# Patient Record
Sex: Female | Born: 1990 | Race: Black or African American | Hispanic: No | Marital: Single | Smoking: Never smoker
Health system: Southern US, Community
[De-identification: ages and names within clinical notes are randomized; demographics above are authoritative.]

## PROBLEM LIST (undated history)

## (undated) DIAGNOSIS — J302 Other seasonal allergic rhinitis: Secondary | ICD-10-CM

## (undated) DIAGNOSIS — M069 Rheumatoid arthritis, unspecified: Secondary | ICD-10-CM

## (undated) HISTORY — PX: HERNIA REPAIR: SHX51

## (undated) HISTORY — DX: Rheumatoid arthritis, unspecified: M06.9

---

## 2008-08-12 ENCOUNTER — Emergency Department (HOSPITAL_COMMUNITY): Admission: EM | Admit: 2008-08-12 | Discharge: 2008-08-12 | Payer: Self-pay | Admitting: Emergency Medicine

## 2010-11-05 LAB — WET PREP, GENITAL
Trich, Wet Prep: NONE SEEN
Yeast Wet Prep HPF POC: NONE SEEN

## 2010-11-05 LAB — POCT URINALYSIS DIP (DEVICE)
Bilirubin Urine: NEGATIVE
Glucose, UA: NEGATIVE mg/dL
Hgb urine dipstick: NEGATIVE
Ketones, ur: NEGATIVE mg/dL
Specific Gravity, Urine: 1.02 (ref 1.005–1.030)
pH: 6.5 (ref 5.0–8.0)

## 2010-11-05 LAB — GC/CHLAMYDIA PROBE AMP, GENITAL: Chlamydia, DNA Probe: NEGATIVE

## 2011-07-05 ENCOUNTER — Emergency Department (INDEPENDENT_AMBULATORY_CARE_PROVIDER_SITE_OTHER)
Admission: EM | Admit: 2011-07-05 | Discharge: 2011-07-05 | Disposition: A | Discharge status: Home / Self Care | Payer: Self-pay | Source: Home / Self Care | Attending: Family Medicine | Admitting: Family Medicine

## 2011-07-05 DIAGNOSIS — N760 Acute vaginitis: Secondary | ICD-10-CM

## 2011-07-05 HISTORY — DX: Other seasonal allergic rhinitis: J30.2

## 2011-07-05 LAB — POCT URINALYSIS DIP (DEVICE)
Glucose, UA: NEGATIVE mg/dL
Nitrite: NEGATIVE
Protein, ur: NEGATIVE mg/dL
Specific Gravity, Urine: >=1.03 (ref 1.005–1.030)
Urobilinogen, UA: 0.2 mg/dL (ref 0.0–1.0)

## 2011-07-05 LAB — WET PREP, GENITAL: Trich, Wet Prep: NONE SEEN

## 2011-07-05 LAB — POCT PREGNANCY, URINE: Preg Test, Ur: NEGATIVE

## 2011-07-05 MED ORDER — FLUCONAZOLE 150 MG PO TABS: 150.0000 mg | ORAL_TABLET | Freq: Once | ORAL | Status: AC

## 2011-07-05 MED ORDER — METRONIDAZOLE 0.75 % VA GEL: 1.0000 | VAGINAL | Status: AC

## 2011-07-05 NOTE — ED Provider Notes (Signed)
History     CSN: [id number] Arrival date & time: 07/05/2011 11:16 AM   First MD Initiated Contact with Patient 07/05/11 1054      Chief Complaint  Patient presents with  . Vaginal Discharge    (Consider location/radiation/quality/duration/timing/severity/associated sxs/prior treatment) Patient is a 20 y.o. female presenting with vaginal discharge. The history is provided by the patient.  Vaginal Discharge This is a new problem. The current episode started more than 1 week ago. The problem occurs constantly. The problem has not changed since onset.Associated symptoms comments: Odor and itching..    Past Medical History  Diagnosis Date  . Seasonal allergies     History reviewed. No pertinent past surgical history.  No family history on file.  History  Substance Use Topics  . Smoking status: Never Smoker   . Smokeless tobacco: Not on file  . Alcohol Use: Yes    OB History    Grav Para Term Preterm Abortions TAB SAB Ect Mult Living                  Review of Systems  Constitutional: Negative.   Genitourinary: Positive for vaginal discharge. Negative for dysuria, urgency, vaginal bleeding and vaginal pain.    Allergies  Review of patient's allergies indicates no known allergies.  Home Medications   Current Outpatient Rx  Name Route Sig Dispense Refill  . ALLEGRA PO Oral Take by mouth as needed.      Marland Kitchen FLUCONAZOLE 150 MG PO TABS Oral Take 1 tablet (150 mg total) by mouth once. 1 tablet 1  . METRONIDAZOLE 0.75 % VA GEL Vaginal Place 1 Applicatorful vaginally 1 day or 1 dose. Nightly for 5 nights 70 g 0    BP 120/81  Pulse 63  Temp(Src) 99 F (37.2 C) (Oral)  Resp 20  SpO2 98%  LMP 07/02/2011  Physical Exam  Nursing note and vitals reviewed. Constitutional: She appears well-developed and well-nourished.  Abdominal: Soft. Bowel sounds are normal.  Genitourinary: Uterus normal. There is no rash or tenderness on the right labia. There is no rash or  tenderness on the left labia. Cervix exhibits no motion tenderness, no discharge and no friability. Right adnexum displays no tenderness. Left adnexum displays no tenderness. No tenderness or bleeding around the vagina. Vaginal discharge found.  Skin: Skin is warm and dry.    ED Course  Procedures (including critical care time)  Labs Reviewed  POCT URINALYSIS DIP (DEVICE) - Abnormal; Notable for the following:    Bilirubin Urine SMALL (*)    All other components within normal limits  POCT PREGNANCY, URINE  POCT PREGNANCY, URINE  POCT URINALYSIS DIPSTICK  GC/CHLAMYDIA PROBE AMP, GENITAL  WET PREP, GENITAL   No results found.   1. Vaginitis       MDM  U/a  neg        Barkley Bruns, MD 07/05/11 1207

## 2011-07-05 NOTE — ED Notes (Signed)
C/o vaginal discharge with itching and odor for 8 days.  Denies dysuria or pain.

## 2011-07-05 NOTE — ED Notes (Signed)
Labs and medications for 12/13 reviewed. Pt. adequately treated for clue cells with Metrogel. GC/chlamydia pending. Katie Sanders  07/05/2011

## 2011-07-06 LAB — GC/CHLAMYDIA PROBE AMP, GENITAL: GC Probe Amp, Genital: NEGATIVE

## 2011-07-09 NOTE — ED Notes (Signed)
GC/chlamydia neg.   Vassie Moselle 07/09/2011

## 2011-07-26 ENCOUNTER — Telehealth (HOSPITAL_COMMUNITY): Payer: Self-pay | Admitting: *Deleted

## 2011-12-20 ENCOUNTER — Emergency Department: Payer: Self-pay | Admitting: Emergency Medicine

## 2011-12-20 LAB — CBC
HGB: 13 g/dL (ref 12.0–16.0)
RBC: 4 10*6/uL (ref 3.80–5.20)
RDW: 12.5 % (ref 11.5–14.5)

## 2011-12-20 LAB — WET PREP, GENITAL

## 2012-11-15 ENCOUNTER — Emergency Department: Payer: Self-pay | Admitting: Emergency Medicine

## 2012-11-15 LAB — CBC
HCT: 36.8 % (ref 35.0–47.0)
MCV: 95 fL (ref 80–100)
Platelet: 253 10*3/uL (ref 150–440)
RBC: 3.86 10*6/uL (ref 3.80–5.20)

## 2012-11-15 LAB — COMPREHENSIVE METABOLIC PANEL
Alkaline Phosphatase: 69 U/L (ref 50–136)
Anion Gap: 6 — ABNORMAL LOW (ref 7–16)
Bilirubin,Total: 0.4 mg/dL (ref 0.2–1.0)
Calcium, Total: 9 mg/dL (ref 8.5–10.1)
Chloride: 105 mmol/L (ref 98–107)
EGFR (African American): >60
EGFR (Non-African Amer.): >60
Potassium: 3.9 mmol/L (ref 3.5–5.1)
SGOT(AST): 18 U/L (ref 15–37)
SGPT (ALT): 26 U/L (ref 12–78)

## 2012-11-15 LAB — URINALYSIS, COMPLETE
Bilirubin,UR: NEGATIVE
Blood: NEGATIVE
Glucose,UR: NEGATIVE mg/dL (ref 0–75)
Protein: NEGATIVE
WBC UR: 1 /HPF (ref 0–5)

## 2012-11-15 LAB — HCG, QUANTITATIVE, PREGNANCY: Beta Hcg, Quant.: 58515 m[IU]/mL — ABNORMAL HIGH

## 2012-11-15 LAB — LIPASE, BLOOD: Lipase: 78 U/L (ref 73–393)

## 2013-07-05 IMAGING — US US OB < 14 WEEKS
1 series · 14 of 28 positions shown · non-contrast
Comparison: none

REASON FOR EXAM: pregnant vaginal bleeding
COMMENTS:

PROCEDURE:     US  - US OB LESS THAN 14 WEEKS  - November 15, 2012  [DATE]
RESULT:     Comparison: None
TECHNIQUE: Multiple transabdominal gray-scale images and endovaginal
gray-scale images of the pelvis performed.

[Series 1: us ob < 14 weeks · 0.23mm/px · 14 of 54 slices shown]
[im 2/54]
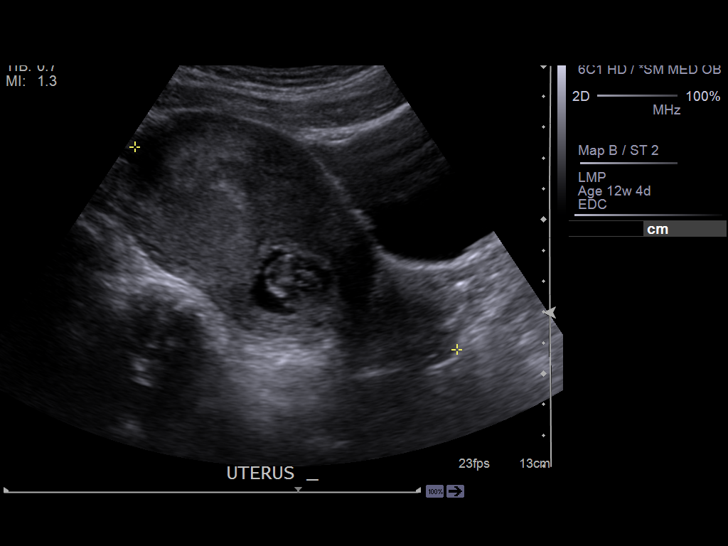
[im 6/54]
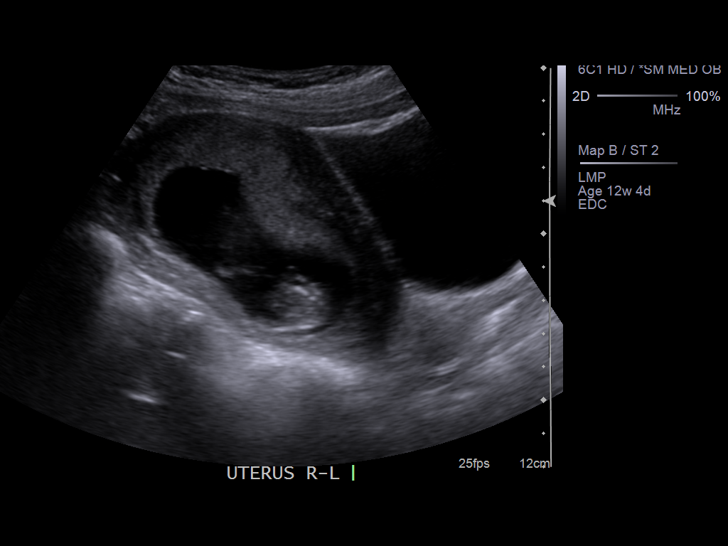
[im 10/54]
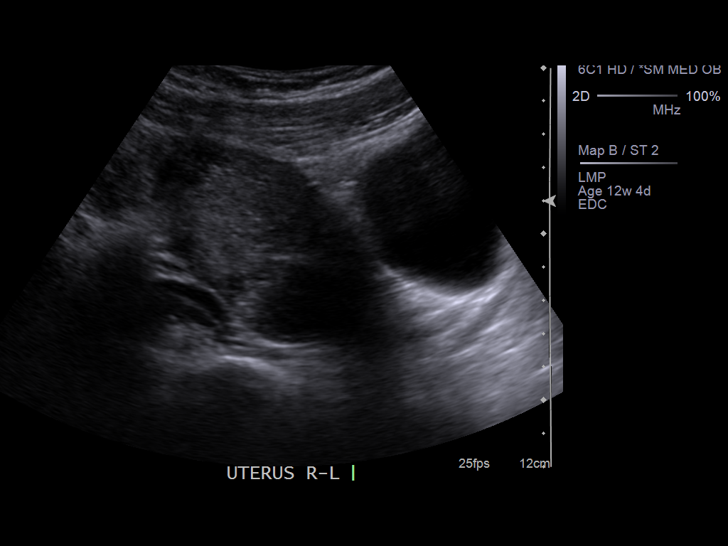
[im 14/54]
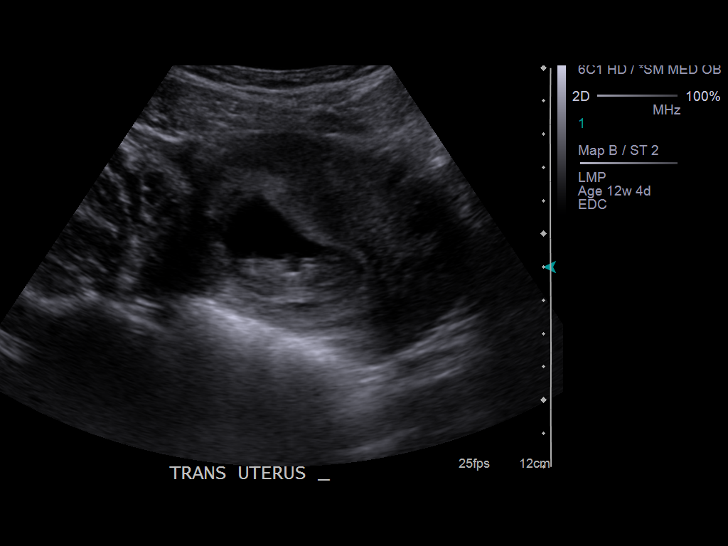
[im 18/54]
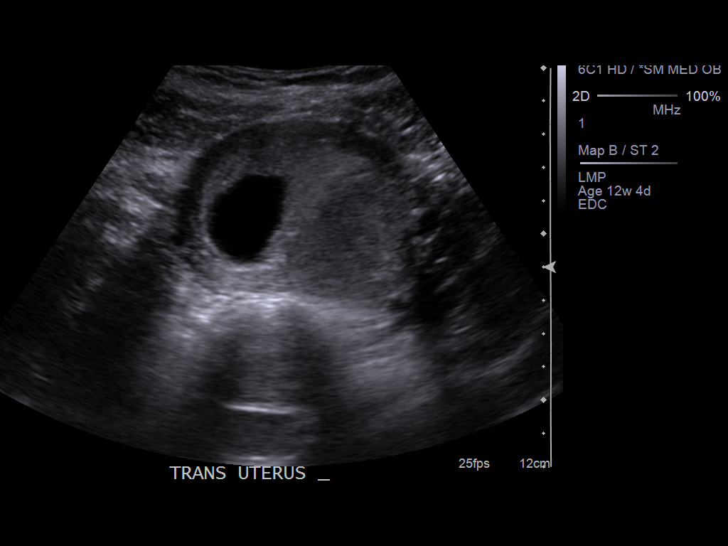
[im 22/54]
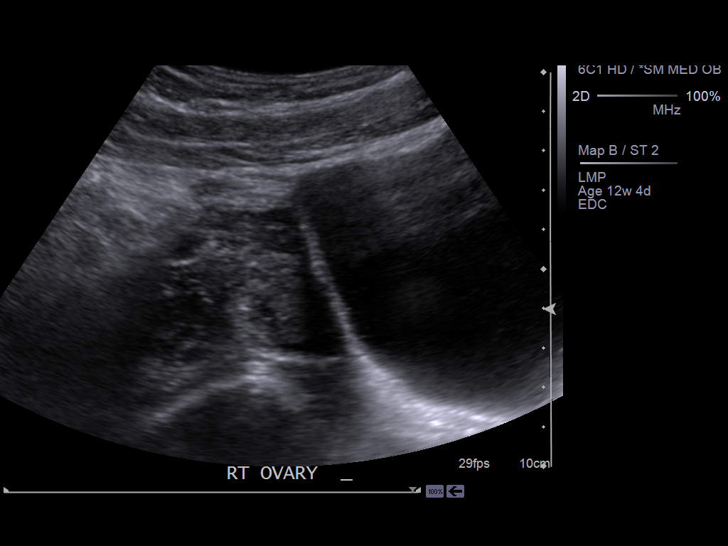
[im 26/54]
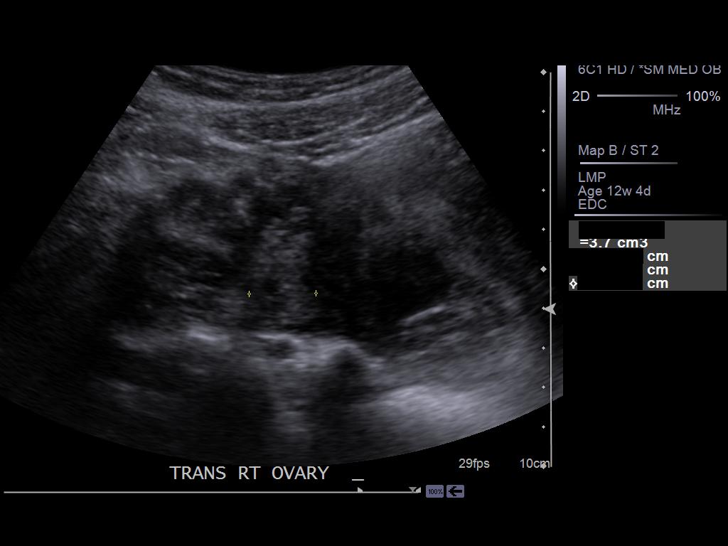
[im 30/54]
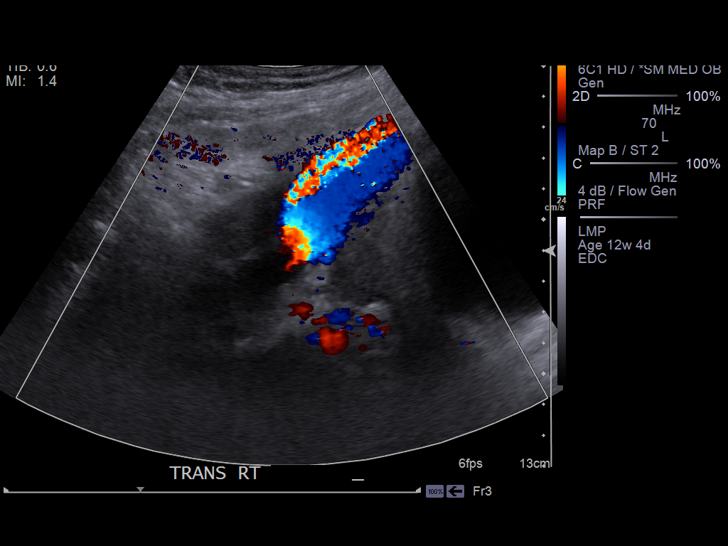
[im 34/54]
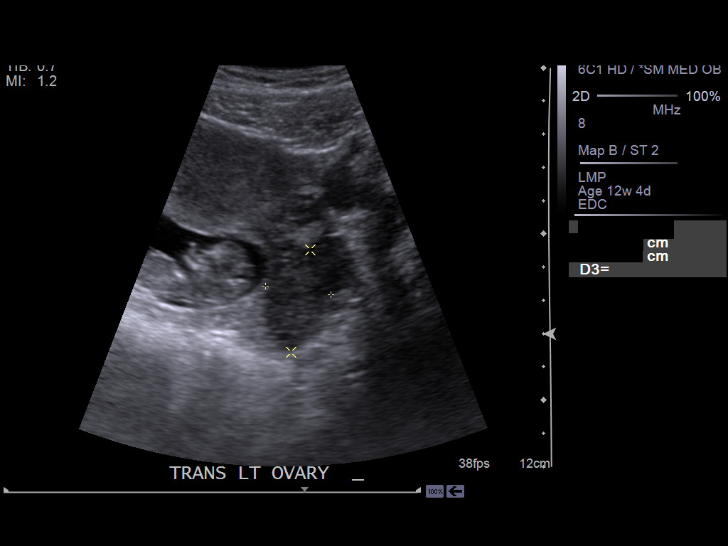
[im 38/54]
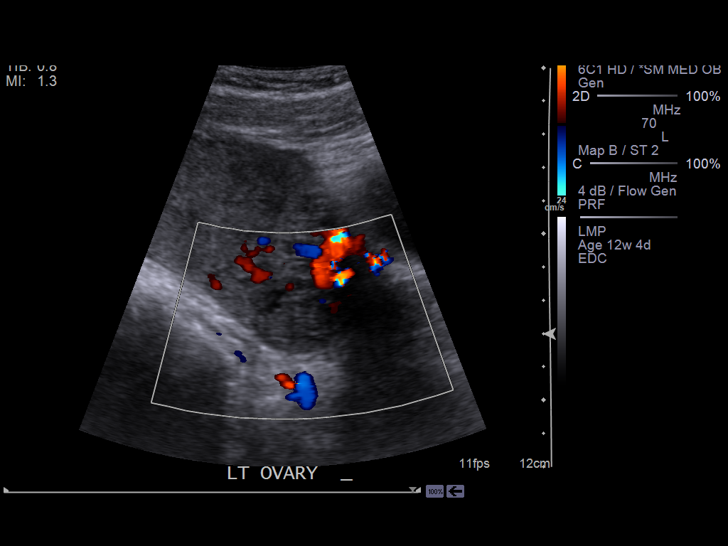
[im 42/54]
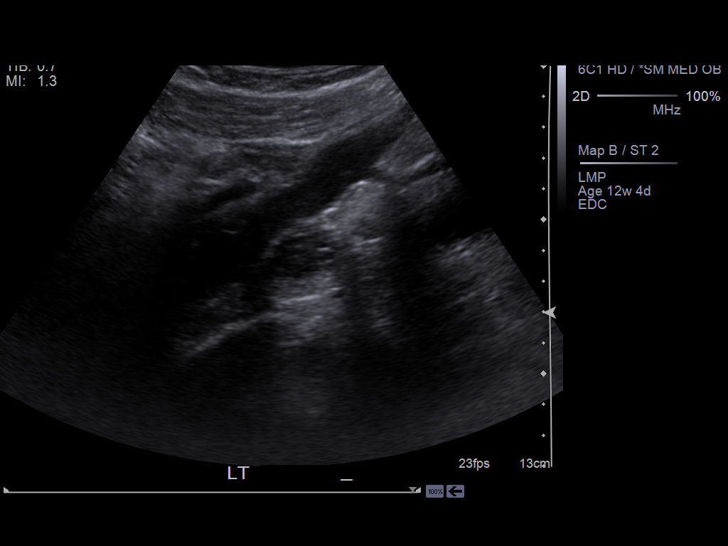
[im 46/54]
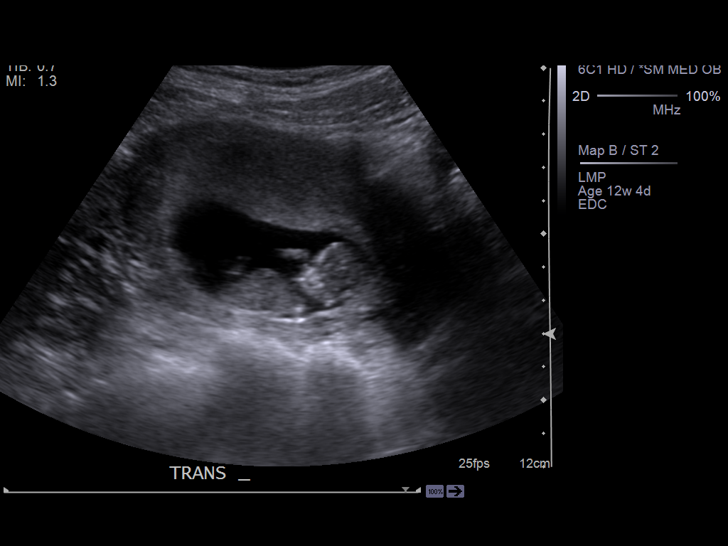
[im 50/54]
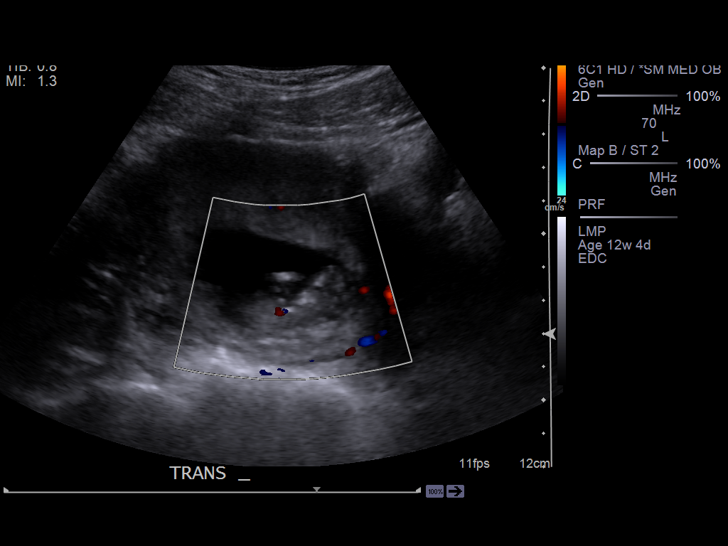
[im 54/54]
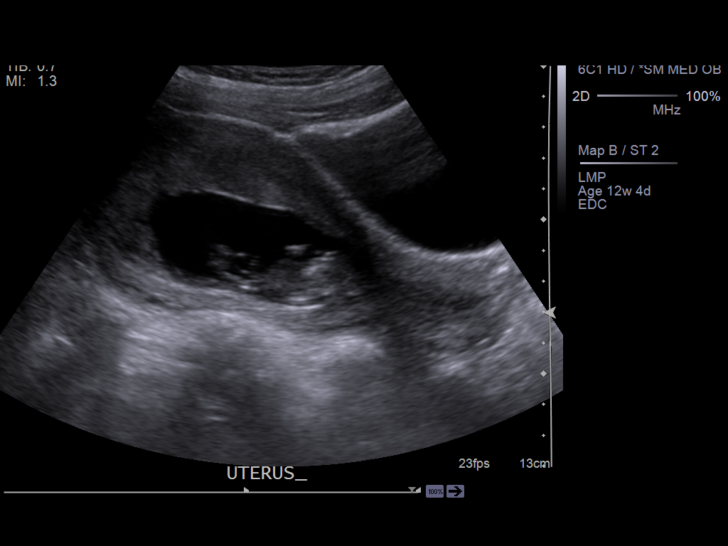

[14 of 28 positions shown; findings below may reference images not displayed]

FINDINGS: There is a single live intrauterine pregnancy visualized with a crown-rump
length of 4.48 cm dating the pregnancy at 11 weeks 2 days. There is a normal
yolk sac. There is a normal fetal heart rate of 165 beats per minute.

The right ovary measures 1.8 x 2.4 x 1.7 cm.  The left ovary measures 2 x
3.1 x 3.1 cm.  There is no adnexal mass.

There is no pelvic free fluid.
IMPRESSION: Single live intrauterine pregnancy dating 11 weeks 2 days with an estimated
due date of 06/04/2013.

[REDACTED]

## 2018-08-14 DIAGNOSIS — R768 Other specified abnormal immunological findings in serum: Secondary | ICD-10-CM | POA: Insufficient documentation

## 2018-08-14 DIAGNOSIS — M0579 Rheumatoid arthritis with rheumatoid factor of multiple sites without organ or systems involvement: Secondary | ICD-10-CM | POA: Insufficient documentation

## 2018-08-14 DIAGNOSIS — Z79899 Other long term (current) drug therapy: Secondary | ICD-10-CM | POA: Insufficient documentation

## 2018-09-08 DIAGNOSIS — E669 Obesity, unspecified: Secondary | ICD-10-CM | POA: Insufficient documentation

## 2019-08-26 DIAGNOSIS — F32A Depression, unspecified: Secondary | ICD-10-CM | POA: Insufficient documentation

## 2020-02-11 DIAGNOSIS — Z79899 Other long term (current) drug therapy: Secondary | ICD-10-CM | POA: Diagnosis not present

## 2020-02-11 DIAGNOSIS — M0579 Rheumatoid arthritis with rheumatoid factor of multiple sites without organ or systems involvement: Secondary | ICD-10-CM | POA: Diagnosis not present

## 2020-02-18 DIAGNOSIS — N6452 Nipple discharge: Secondary | ICD-10-CM | POA: Diagnosis not present

## 2020-03-22 DIAGNOSIS — Z419 Encounter for procedure for purposes other than remedying health state, unspecified: Secondary | ICD-10-CM | POA: Diagnosis not present

## 2020-04-21 DIAGNOSIS — Z419 Encounter for procedure for purposes other than remedying health state, unspecified: Secondary | ICD-10-CM | POA: Diagnosis not present

## 2020-05-22 DIAGNOSIS — Z419 Encounter for procedure for purposes other than remedying health state, unspecified: Secondary | ICD-10-CM | POA: Diagnosis not present

## 2020-06-21 DIAGNOSIS — Z419 Encounter for procedure for purposes other than remedying health state, unspecified: Secondary | ICD-10-CM | POA: Diagnosis not present

## 2020-07-22 DIAGNOSIS — Z419 Encounter for procedure for purposes other than remedying health state, unspecified: Secondary | ICD-10-CM | POA: Diagnosis not present

## 2020-08-22 DIAGNOSIS — Z419 Encounter for procedure for purposes other than remedying health state, unspecified: Secondary | ICD-10-CM | POA: Diagnosis not present

## 2020-09-19 DIAGNOSIS — Z419 Encounter for procedure for purposes other than remedying health state, unspecified: Secondary | ICD-10-CM | POA: Diagnosis not present

## 2020-10-20 DIAGNOSIS — Z419 Encounter for procedure for purposes other than remedying health state, unspecified: Secondary | ICD-10-CM | POA: Diagnosis not present

## 2020-11-19 DIAGNOSIS — Z419 Encounter for procedure for purposes other than remedying health state, unspecified: Secondary | ICD-10-CM | POA: Diagnosis not present

## 2020-12-13 DIAGNOSIS — H5213 Myopia, bilateral: Secondary | ICD-10-CM | POA: Diagnosis not present

## 2020-12-20 DIAGNOSIS — Z419 Encounter for procedure for purposes other than remedying health state, unspecified: Secondary | ICD-10-CM | POA: Diagnosis not present

## 2021-01-19 DIAGNOSIS — Z419 Encounter for procedure for purposes other than remedying health state, unspecified: Secondary | ICD-10-CM | POA: Diagnosis not present

## 2021-02-19 DIAGNOSIS — Z419 Encounter for procedure for purposes other than remedying health state, unspecified: Secondary | ICD-10-CM | POA: Diagnosis not present

## 2021-03-22 DIAGNOSIS — Z419 Encounter for procedure for purposes other than remedying health state, unspecified: Secondary | ICD-10-CM | POA: Diagnosis not present

## 2021-04-10 ENCOUNTER — Encounter: Payer: Self-pay | Admitting: Emergency Medicine

## 2021-04-10 ENCOUNTER — Ambulatory Visit
Admission: EM | Admit: 2021-04-10 | Discharge: 2021-04-10 | Disposition: A | Discharge status: Home / Self Care | Payer: Managed Care, Other (non HMO) | Attending: Emergency Medicine | Admitting: Emergency Medicine

## 2021-04-10 ENCOUNTER — Other Ambulatory Visit: Payer: Self-pay

## 2021-04-10 DIAGNOSIS — S90852A Superficial foreign body, left foot, initial encounter: Secondary | ICD-10-CM | POA: Diagnosis not present

## 2021-04-10 DIAGNOSIS — J069 Acute upper respiratory infection, unspecified: Secondary | ICD-10-CM

## 2021-04-10 DIAGNOSIS — L089 Local infection of the skin and subcutaneous tissue, unspecified: Secondary | ICD-10-CM | POA: Diagnosis not present

## 2021-04-10 MED ORDER — CEPHALEXIN 500 MG PO CAPS: 500.0000 mg | ORAL_CAPSULE | Freq: Four times a day (QID) | ORAL | 0 refills | Status: DC

## 2021-04-10 NOTE — Discharge Instructions (Addendum)
Take the cephalexin as directed.  Keep your wound clean and dry.  Wash gently with soap and water twice a day, then apply an antibiotic ointment and bandage.  Return here or follow-up with your primary care provider right away if you note signs of worsening infection such as redness, pus, fever.    Your COVID test is pending.  You should self quarantine until the test result is back.  Take Tylenol or ibuprofen as needed for fever or discomfort.  Rest and keep yourself hydrated.  Follow-up with your primary care provider if your symptoms are not improving.

## 2021-04-10 NOTE — ED Triage Notes (Signed)
Pt here for Covid testing from nasal congestion. And would like left foot looked at for discoloration after stepping on something in August.

## 2021-04-10 NOTE — ED Provider Notes (Signed)
Renaldo Fiddler    CSN: [id number] Arrival date & time: 04/10/21  0623      History   Chief Complaint Chief Complaint  Patient presents with   Foot Pain   Nasal Congestion     HPI Katie Sanders is a 30 y.o. female.  Patient presents with concern for foreign body in her left foot.  She stepped on a wooden piece of broken furniture at home 3 weeks ago; she removed the piece of wood but thinks there may be some left behind because the area is swollen and tender.  No redness or drainage.  Patient reports her tetanus is up-to-date.  She also requests a COVID test today because she has had cough, runny nose, and congestion for 2 to 3 days.  She denies fever, chills, shortness of breath, or other symptoms.  The history is provided by the patient.   Past Medical History:  Diagnosis Date   Seasonal allergies     There are no problems to display for this patient.   History reviewed. No pertinent surgical history.  OB History   No obstetric history on file.      Home Medications    Prior to Admission medications   Medication Sig Start Date End Date Taking? Authorizing Provider  Adalimumab 20 MG/0.2ML PSKT Inject into the skin.   Yes [provider]  cephALEXin (KEFLEX) 500 MG capsule Take 1 capsule (500 mg total) by mouth 4 (four) times daily. 04/10/21  Yes Mickie Bail, NP  folic acid (FOLVITE) 1 MG tablet Take by mouth. 03/20/21 09/16/21 Yes [provider]  Fexofenadine HCl (ALLEGRA PO) Take by mouth as needed.      [provider]  methotrexate (RHEUMATREX) 2.5 MG tablet Take 20 mg by mouth once a week. 03/20/21   [provider]    Family History Family History  Family history unknown: Yes    Social History Social History   Tobacco Use   Smoking status: Never   Smokeless tobacco: Never  Substance Use Topics   Alcohol use: Yes   Drug use: No     Allergies   Latex   Review of Systems Review of Systems   Constitutional:  Negative for chills and fever.  HENT:  Positive for congestion and rhinorrhea. Negative for ear pain and sore throat.   Respiratory:  Positive for cough. Negative for shortness of breath.   Cardiovascular:  Negative for chest pain and palpitations.  Skin:  Positive for wound. Negative for color change.  Neurological:  Negative for weakness and numbness.  All other systems reviewed and are negative.   Physical Exam Triage Vital Signs ED Triage Vitals  Enc Vitals Group     BP 04/10/21 0932 129/79     Pulse Rate 04/10/21 0932 94     Resp 04/10/21 0932 16     Temp 04/10/21 0932 98.4 F (36.9 C)     Temp src --      SpO2 04/10/21 0932 98 %     Weight --      Height --      Head Circumference --      Peak Flow --      Pain Score 04/10/21 0901 0     Pain Loc --      Pain Edu? --      Excl. in GC? --    No data found.  Updated Vital Signs BP 129/79   Pulse 94   Temp 98.4 F (  36.9 C)   Resp 16   SpO2 98%   Visual Acuity Right Eye Distance:   Left Eye Distance:   Bilateral Distance:    Right Eye Near:   Left Eye Near:    Bilateral Near:     Physical Exam Vitals and nursing note reviewed.  Constitutional:      General: She is not in acute distress.    Appearance: She is well-developed. She is not ill-appearing.  HENT:     Head: Normocephalic and atraumatic.     Right Ear: Tympanic membrane normal.     Left Ear: Tympanic membrane normal.     Nose: Rhinorrhea present.     Mouth/Throat:     Mouth: Mucous membranes are moist.     Pharynx: Oropharynx is clear.  Eyes:     Conjunctiva/sclera: Conjunctivae normal.  Cardiovascular:     Rate and Rhythm: Normal rate and regular rhythm.     Heart sounds: Normal heart sounds.  Pulmonary:     Effort: Pulmonary effort is normal. No respiratory distress.     Breath sounds: Normal breath sounds.  Abdominal:     Palpations: Abdomen is soft.     Tenderness: There is no abdominal tenderness.   Musculoskeletal:        General: Normal range of motion.     Cervical back: Neck supple.  Skin:    General: Skin is warm and dry.     Findings: Lesion present.     Comments: 4 mm circular callused lesion on left medial heel with central 1 mm black-brown spot which appears to be possible foreign body.  Neurological:     General: No focal deficit present.     Mental Status: She is alert and oriented to person, place, and time.     Gait: Gait normal.  Psychiatric:        Mood and Affect: Mood normal.        Behavior: Behavior normal.     UC Treatments / Results  Labs (all labs ordered are listed, but only abnormal results are displayed) Labs Reviewed  NOVEL CORONAVIRUS, NAA    EKG   Radiology No results found.  Procedures Foreign Body Removal  Date/Time: 04/10/2021 10:45 AM Performed by: Mickie Bail, NP Authorized by: Mickie Bail, NP   Consent:    Consent obtained:  Verbal   Consent given by:  Patient   Risks discussed:  Bleeding, infection and pain Universal protocol:    Procedure explained and questions answered to patient or proxy's satisfaction: yes   Location:    Location:  Foot   Foot location:  L heel Anesthesia:    Anesthesia method:  None Procedure type:    Procedure complexity:  Simple Procedure details:    Localization method:  Visualized   Bloodless field: yes     Foreign bodies recovered:  1   Description:  3 mm wood splinter   Intact foreign body removal: yes   Post-procedure details:    Neurovascular status: intact     Confirmation:  No additional foreign bodies on visualization   Skin closure:  None   Dressing:  Antibiotic ointment and sterile dressing   Procedure completion:  Tolerated well, no immediate complications Comments:     Shaved callused skin with #11 scalpel until foreign body fully visualized and easily removed with tip of scalpel.  Scant purulent drainage from wound after splinter removed.  (including critical care  time)  Medications Ordered in UC Medications -  No data to display  Initial Impression / Assessment and Plan / UC Course  I have reviewed the triage vital signs and the nursing notes.  Pertinent labs & imaging results that were available during my care of the patient were reviewed by me and considered in my medical decision making (see chart for details).  Superficial foreign body of left heel with infection.  Viral URI.  Splinter removed from foot.  Scant purulent drainage from wound.  Treating with cephalexin.  Wound care instructions and signs of worsening infection discussed with patient.  Instructed her to return here or see her PCP if she notes signs of worsening infection. COVID pending.  Instructed patient to self quarantine per CDC guidelines.  Discussed symptomatic treatment including Tylenol or ibuprofen, rest, hydration.  Instructed patient to follow up with PCP if symptoms are not improving.   Patient agrees to plan of care.    Final Clinical Impressions(s) / UC Diagnoses   Final diagnoses:  Superficial foreign body of left foot with infection, initial encounter  Viral URI     Discharge Instructions      Take the cephalexin as directed.  Keep your wound clean and dry.  Wash gently with soap and water twice a day, then apply an antibiotic ointment and bandage.  Return here or follow-up with your primary care provider right away if you note signs of worsening infection such as redness, pus, fever.    Your COVID test is pending.  You should self quarantine until the test result is back.  Take Tylenol or ibuprofen as needed for fever or discomfort.  Rest and keep yourself hydrated.  Follow-up with your primary care provider if your symptoms are not improving.         ED Prescriptions     Medication Sig Dispense Auth. Provider   cephALEXin (KEFLEX) 500 MG capsule Take 1 capsule (500 mg total) by mouth 4 (four) times daily. 28 capsule Mickie Bail, NP      PDMP not  reviewed this encounter.   Mickie Bail, NP 04/10/21 1050

## 2021-04-11 LAB — SARS-COV-2, NAA 2 DAY TAT

## 2021-04-11 LAB — NOVEL CORONAVIRUS, NAA: SARS-CoV-2, NAA: NOT DETECTED

## 2021-04-21 DIAGNOSIS — Z419 Encounter for procedure for purposes other than remedying health state, unspecified: Secondary | ICD-10-CM | POA: Diagnosis not present

## 2021-05-17 ENCOUNTER — Other Ambulatory Visit: Payer: Self-pay

## 2021-05-17 ENCOUNTER — Telehealth: Payer: Self-pay

## 2021-05-17 ENCOUNTER — Ambulatory Visit (INDEPENDENT_AMBULATORY_CARE_PROVIDER_SITE_OTHER): Payer: Managed Care, Other (non HMO) | Admitting: Internal Medicine

## 2021-05-17 ENCOUNTER — Ambulatory Visit
Admission: RE | Admit: 2021-05-17 | Discharge: 2021-05-17 | Disposition: A | Discharge status: Home / Self Care | Payer: Managed Care, Other (non HMO) | Source: Ambulatory Visit | Attending: Internal Medicine | Admitting: Internal Medicine

## 2021-05-17 ENCOUNTER — Encounter: Payer: Self-pay | Admitting: Internal Medicine

## 2021-05-17 VITALS — BP 115/70 | HR 65 | Resp 14 | Ht 62.0 in | Wt 198.0 lb

## 2021-05-17 DIAGNOSIS — E559 Vitamin D deficiency, unspecified: Secondary | ICD-10-CM

## 2021-05-17 DIAGNOSIS — M0579 Rheumatoid arthritis with rheumatoid factor of multiple sites without organ or systems involvement: Secondary | ICD-10-CM

## 2021-05-17 DIAGNOSIS — Z79899 Other long term (current) drug therapy: Secondary | ICD-10-CM | POA: Diagnosis not present

## 2021-05-17 DIAGNOSIS — Z113 Encounter for screening for infections with a predominantly sexual mode of transmission: Secondary | ICD-10-CM | POA: Diagnosis not present

## 2021-05-17 MED ORDER — METHOTREXATE 2.5 MG PO TABS: 20.0000 mg | ORAL_TABLET | ORAL | 2 refills | Status: DC

## 2021-05-17 MED ORDER — HUMIRA (2 PEN) 40 MG/0.4ML ~~LOC~~ AJKT: AUTO-INJECTOR | SUBCUTANEOUS | 6 refills | Status: DC

## 2021-05-17 MED ORDER — FOLIC ACID 1 MG PO TABS: 2.0000 mg | ORAL_TABLET | Freq: Every day | ORAL | 1 refills | Status: DC

## 2021-05-17 MED ORDER — CHOLECALCIFEROL 25 MCG (1000 UT) PO TABS: 1000.0000 [IU] | ORAL_TABLET | Freq: Every day | ORAL | 1 refills | Status: DC

## 2021-05-17 NOTE — Progress Notes (Signed)
Office Visit Note  Patient: Katie Sanders             Date of Birth: Oct 31, 1990           MRN: [id number]             PCP: Pcp, No Referring: Tessie Fass, MD Visit Date: 05/17/2021 Occupation: [employer]  Subjective:  New Patient (Initial Visit) (Moved from [place])   History of Present Illness: Katie Sanders is a 30 y.o. 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 She is transferring care from [facility] rheumatology to here due to relocation.  Symptoms started with joint pain particularly involving her bilateral hands and knees since 5 or 6 years ago but she mostly ignored these until severity increased evaluation for this started treatment with rheumatology in about 2020 initially hydroxychloroquine was not effective or not well-tolerated he subsequently started methotrexate and then after incomplete response also started Humira.  Disease has been well controlled on this regimen although she did experience missing her Humira treatment for an extra approximately 2 weeks due to waiting on prior authorization for prescription refill causing some symptom exacerbation and she took a short steroid taper about a month ago due to this. She has experienced some hair thinning with the methotrexate treatment but no other major side effect problems since increasing to 2 mg of folic acid daily.  She also describes a problem of brain fog with concentration difficulties she feels like she has attention deficit problems that this is worsened during the past 2 years.  This did not correlate in particular with a change to third shift work, although her sleep has been more disrupted with this schedule change. Previous hand x-rays did not demonstrate any erosive disease changes.  She does not recall having any prior chest x-ray.  Labs  09/2020 RF+ CCP+ ANA 1:320  08/2019 HBV neg HCV neg HIV neg  12/2020 CBC  wnl CMP wnl  Activities of Daily Living:  Patient reports morning stiffness for 40 minutes.   Patient Denies nocturnal pain.  Difficulty dressing/grooming: Denies Difficulty climbing stairs: Denies Difficulty getting out of chair: Denies Difficulty using hands for taps, buttons, cutlery, and/or writing: Denies  Review of Systems  Constitutional:  Negative for fatigue.  HENT:  Positive for mouth dryness.   Eyes:  Negative for dryness.  Respiratory:  Negative for shortness of breath.   Cardiovascular:  Negative for swelling in legs/feet.  Gastrointestinal:  Positive for constipation.  Endocrine: Negative for increased urination.  Genitourinary:  Negative for difficulty urinating.  Musculoskeletal:  Positive for joint pain, joint pain, joint swelling and morning stiffness.  Skin:  Negative for rash.  Allergic/Immunologic: Positive for susceptible to infections.  Neurological:  Negative for numbness.  Hematological:  Negative for bruising/bleeding tendency.  Psychiatric/Behavioral:  Positive for sleep disturbance.    PMFS History:  Patient Active Problem List   Diagnosis Date Noted   Vitamin D deficiency 05/17/2021   Routine screening for STI (sexually transmitted infection) 05/17/2021   Depression 08/26/2019   Obesity (BMI 35.0-39.9 without comorbidity) 09/08/2018   Positive ANA (antinuclear antibody) 08/14/2018   Rheumatoid arthritis involving multiple sites with positive rheumatoid factor (HCC) 08/14/2018   High risk medication use 08/14/2018    Past Medical History:  Diagnosis Date   Rheumatoid arthritis (HCC)    Seasonal allergies     Family History  Problem Relation Age of Onset  Hypertension Mother    Thyroid disease Mother    Hypertension Father    Past Surgical History:  Procedure Laterality Date   HERNIA REPAIR     Social History   Social History Narrative   Not on file   Immunization History  Administered Date(s) Administered   Influenza,inj,Quad  PF,6+ Mos 06/21/2014   PFIZER(Purple Top)SARS-COV-2 Vaccination 07/27/2019, 09/06/2019   Pneumococcal Conjugate-13 11/20/2018   Pneumococcal Polysaccharide-23 02/22/2019   Tdap 09/20/2014     Objective: Vital Signs: BP 115/70 (BP Location: Right Arm, Patient Position: Sitting, Cuff Size: Normal)   Pulse 65   Resp 14   Ht 5\' 2"  (1.575 m)   Wt 198 lb (89.8 kg)   LMP 05/17/2021 (Exact Date)   BMI 36.21 kg/m    Physical Exam Constitutional:      Appearance: She is obese.  HENT:     Right Ear: External ear normal.     Left Ear: External ear normal.     Mouth/Throat:     Mouth: Mucous membranes are moist.     Pharynx: Oropharynx is clear.  Eyes:     Conjunctiva/sclera: Conjunctivae normal.  Cardiovascular:     Rate and Rhythm: Normal rate and regular rhythm.  Pulmonary:     Effort: Pulmonary effort is normal.     Breath sounds: Normal breath sounds.  Musculoskeletal:     Right lower leg: No edema.     Left lower leg: No edema.  Skin:    General: Skin is warm and dry.     Findings: No rash.  Neurological:     General: No focal deficit present.     Mental Status: She is alert.  Psychiatric:        Mood and Affect: Mood normal.     Musculoskeletal Exam:  Neck full ROM no tenderness Shoulders full ROM no tenderness or swelling Elbows full ROM no tenderness or swelling Wrists full ROM no tenderness or swelling Fingers full ROM no tenderness or swelling Knees full ROM no tenderness or swelling Ankles full ROM no tenderness or swelling   CDAI Exam: CDAI Score: 2  Patient Global: 0 mm; Provider Global: 20 mm Swollen: 0 ; Tender: 0  Joint Exam 05/17/2021   All documented joints were normal     Investigation: No additional findings.  Imaging: DG Chest 2 View  Result Date: 05/17/2021 CLINICAL DATA:  Baseline chest x-ray.  Rheumatoid arthritis EXAM: CHEST - 2 VIEW COMPARISON:  None. FINDINGS: Heart size and mediastinal contours are within normal limits. No  suspicious pulmonary opacities identified. No pleural effusion or pneumothorax visualized. No acute osseous abnormality appreciated. IMPRESSION: No acute intrathoracic process identified. Electronically Signed   By: 05/19/2021 M.D.   On: 05/17/2021 15:58    Recent Labs: Lab Results  Component Value Date   WBC 5.9 11/15/2012   HGB 12.7 11/15/2012   PLT 253 11/15/2012   NA 137 11/15/2012   K 3.9 11/15/2012   CL 105 11/15/2012   CO2 26 11/15/2012   GLUCOSE 77 11/15/2012   BUN 8 11/15/2012   CREATININE 0.48 (L) 11/15/2012   BILITOT 0.4 11/15/2012   ALKPHOS 69 11/15/2012   AST 18 11/15/2012   ALT 26 11/15/2012   PROT 7.9 11/15/2012   ALBUMIN 3.8 11/15/2012   CALCIUM 9.0 11/15/2012   GFRAA >60 11/15/2012    Speciality Comments: No specialty comments available.  Procedures:  No procedures performed Allergies: Patient has no active allergies.   Assessment / Plan:  Visit Diagnoses: Rheumatoid arthritis involving multiple sites with positive rheumatoid factor (HCC) - Plan: Adalimumab (HUMIRA PEN) 40 MG/0.4ML PNKT, methotrexate (RHEUMATREX) 2.5 MG tablet, folic acid (FOLVITE) 1 MG tablet, Sedimentation rate  Disease appears to be in low activity or remission at this time although she had recent steroid use for a flare but this was with treatment interruption.  Plan to continue the current regimen Humira 40 mg subcu q. 14 days and methotrexate 20 mg p.o. weekly.  Continue folic acid 2 mg p.o. daily.  We will check sedimentation rate today for disease activity monitoring.  High risk medication use - Plan: CBC with Differential/Platelet, COMPLETE METABOLIC PANEL WITH GFR, QuantiFERON-TB Gold Plus, DG Chest 2 View  Discussed side effects and monitoring for long-term use of methotrexate and Humira.  We will check a CBC and CMP today with plan for every 64-month monitoring labs from June looks fine.  We will check annual tuberculosis screen today.  We will get 2 view chest x-ray no  prior on file for review but normal pulmonary exam and no symptom complaints.  I discussed importance of contraception or birth control during ongoing use of methotrexate treatment she has no plans for considering pregnancy within the next 2 years although not currently taking any reliable chemical contraception not interested in referral for this at this time.  Vitamin D deficiency - Plan: Cholecalciferol 25 MCG (1000 UT) tablet, VITAMIN D 25 Hydroxy (Vit-D Deficiency, Fractures)  Suspected diagnosis with chronic inflammatory arthritis and intermittent steroid treatment she will benefit with precautions for osteoporosis.  We will check a vitamin D level today and go ahead and recommended starting 1000 unit daily supplementation.  Routine screening for STI (sexually transmitted infection) - Plan: HIV Antibody (routine testing w rflx), Hepatitis C antibody, Hepatitis B surface antigen  She request screening for HIV and hepatitis related to some events with sexual partners we will check blood test markers for these today.  She has had prior hepatitis and HIV screening reviewed from [facility] records in 2021 which were negative.  Orders: Orders Placed This Encounter  Procedures   DG Chest 2 View   CBC with Differential/Platelet   COMPLETE METABOLIC PANEL WITH GFR   QuantiFERON-TB Gold Plus   VITAMIN D 25 Hydroxy (Vit-D Deficiency, Fractures)   Sedimentation rate   HIV Antibody (routine testing w rflx)   Hepatitis C antibody   Hepatitis B surface antigen   Meds ordered this encounter  Medications   Adalimumab (HUMIRA PEN) 40 MG/0.4ML PNKT    Sig: INJECT 1 PEN UNDER THE SKIN EVERY 14 DAYS.    Dispense:  2 each    Refill:  6   methotrexate (RHEUMATREX) 2.5 MG tablet    Sig: Take 8 tablets (20 mg total) by mouth once a week.    Dispense:  40 tablet    Refill:  2   folic acid (FOLVITE) 1 MG tablet    Sig: Take 2 tablets (2 mg total) by mouth daily.    Dispense:  180 tablet    Refill:  1    Cholecalciferol 25 MCG (1000 UT) tablet    Sig: Take 1 tablet (1,000 Units total) by mouth daily.    Dispense:  90 tablet    Refill:  1     Follow-Up Instructions: Return in about 3 months (around 08/17/2021) for New pt RA on MTX+ADA f/u 31mos.   Fuller Plan, MD  Note - This record has been created using [employer].  Chart  creation errors have been sought, but may not always  have been located. Such creation errors do not reflect on  the standard of medical care.

## 2021-05-17 NOTE — Telephone Encounter (Signed)
Dose: Humira 40mg  SQ every 14 days  This is continuation of therapy  Dx: RA (M05.79)

## 2021-05-17 NOTE — Patient Instructions (Signed)
I recommend we get a chest xray, this order was sent to [facility] at [address]  I am checking your vitamin D level today to see if the current supplement is the correct amount. I am checking the sedimentation rate test as a monitor for disease activity. I am checking the blood count and liver function and tuberculosis screening for medication monitoring.

## 2021-05-17 NOTE — Telephone Encounter (Signed)
Please apply for Humira, per Dr.Rice. thanks!   Consent obtained and sent to the scan center.   Patient is currently on humira and has one injection left at home.

## 2021-05-18 NOTE — Telephone Encounter (Signed)
Submitted a Prior Authorization request to [facility] for HUMIRA via CoverMyMeds. Will update once we receive a response.   Key: [id number]

## 2021-05-19 LAB — CBC WITH DIFFERENTIAL/PLATELET
Absolute Monocytes: 288 cells/uL (ref 200–950)
Basophils Absolute: 19 cells/uL (ref 0–200)
Basophils Relative: 0.6 %
Eosinophils Absolute: 118 cells/uL (ref 15–500)
Eosinophils Relative: 3.7 %
HCT: 38.1 % (ref 35.0–45.0)
Hemoglobin: 12.3 g/dL (ref 11.7–15.5)
Lymphs Abs: 1600 cells/uL (ref 850–3900)
MCH: 31.1 pg (ref 27.0–33.0)
MCHC: 32.3 g/dL (ref 32.0–36.0)
MCV: 96.5 fL (ref 80.0–100.0)
MPV: 10.7 fL (ref 7.5–12.5)
Monocytes Relative: 9 %
Neutro Abs: 1174 cells/uL — ABNORMAL LOW (ref 1500–7800)
Neutrophils Relative %: 36.7 %
Platelets: 254 10*3/uL (ref 140–400)
RBC: 3.95 10*6/uL (ref 3.80–5.10)
RDW: 13.1 % (ref 11.0–15.0)
Total Lymphocyte: 50 %
WBC: 3.2 10*3/uL — ABNORMAL LOW (ref 3.8–10.8)

## 2021-05-19 LAB — COMPLETE METABOLIC PANEL WITH GFR
AG Ratio: 1.5 (calc) (ref 1.0–2.5)
ALT: 23 U/L (ref 6–29)
AST: 17 U/L (ref 10–30)
Albumin: 4.1 g/dL (ref 3.6–5.1)
Alkaline phosphatase (APISO): 57 U/L (ref 31–125)
BUN: 10 mg/dL (ref 7–25)
CO2: 28 mmol/L (ref 20–32)
Calcium: 9.4 mg/dL (ref 8.6–10.2)
Chloride: 105 mmol/L (ref 98–110)
Creat: 0.73 mg/dL (ref 0.50–0.97)
Globulin: 2.8 g/dL (calc) (ref 1.9–3.7)
Glucose, Bld: 79 mg/dL (ref 65–99)
Potassium: 4.2 mmol/L (ref 3.5–5.3)
Sodium: 140 mmol/L (ref 135–146)
Total Bilirubin: 0.3 mg/dL (ref 0.2–1.2)
Total Protein: 6.9 g/dL (ref 6.1–8.1)
eGFR: 113 mL/min/{1.73_m2} (ref >=60)

## 2021-05-19 LAB — QUANTIFERON-TB GOLD PLUS
Mitogen-NIL: >10 IU/mL
NIL: 0.02 IU/mL
QuantiFERON-TB Gold Plus: NEGATIVE
TB1-NIL: 0 IU/mL
TB2-NIL: 0 IU/mL

## 2021-05-19 LAB — VITAMIN D 25 HYDROXY (VIT D DEFICIENCY, FRACTURES): Vit D, 25-Hydroxy: 21 ng/mL — ABNORMAL LOW (ref 30–100)

## 2021-05-19 LAB — SEDIMENTATION RATE: Sed Rate: 14 mm/h (ref 0–20)

## 2021-05-19 LAB — HEPATITIS B SURFACE ANTIGEN: Hepatitis B Surface Ag: NONREACTIVE

## 2021-05-19 LAB — HIV ANTIBODY (ROUTINE TESTING W REFLEX): HIV 1&2 Ab, 4th Generation: NONREACTIVE

## 2021-05-19 LAB — HEPATITIS C ANTIBODY
Hepatitis C Ab: NONREACTIVE
SIGNAL TO CUT-OFF: 0.05 (ref <1.00)

## 2021-05-21 MED ORDER — HUMIRA (2 PEN) 40 MG/0.4ML ~~LOC~~ AJKT: AUTO-INJECTOR | SUBCUTANEOUS | 6 refills | Status: DC

## 2021-05-21 NOTE — Telephone Encounter (Addendum)
Received fax stating that PA was previous approved through [facility] on 04/25/2021. Additionally, this is patient's secondary insurance and PA request needs to be submitted to primary insurance. Attempted to submit PA request to [facility], however PA has previously been approved and an additional authorization is not required.   Case #: [id number]  [facility].

## 2021-05-21 NOTE — Addendum Note (Signed)
Addended by: Murrell Redden on: 05/21/2021 10:30 AM   Modules accepted: Orders

## 2021-05-21 NOTE — Telephone Encounter (Signed)
Rx for Humira 40mg  SQ every 14 days sent to [facility]. Added pharmacy to patient's preferred med list.  , PharmD, MPH, BCPS Clinical Pharmacist (Rheumatology and Pulmonology)

## 2021-05-22 DIAGNOSIS — Z419 Encounter for procedure for purposes other than remedying health state, unspecified: Secondary | ICD-10-CM | POA: Diagnosis not present

## 2021-05-24 NOTE — Progress Notes (Signed)
Lab results look okay to continue medications, her white blood cell count is slightly low 3.2 normal is 3.8. We can just monitor this for now if it decreases more over time we might have to reduce some medication. Vitamin D is low at 21 this should be over 30 she should increase daily supplementation by 1000 units daily. Tests for hepatitis and for HIV and for TB are all negative.

## 2021-05-24 NOTE — Progress Notes (Signed)
Chest xray looks completely fine as well.

## 2021-06-21 DIAGNOSIS — Z419 Encounter for procedure for purposes other than remedying health state, unspecified: Secondary | ICD-10-CM | POA: Diagnosis not present

## 2021-07-22 DIAGNOSIS — Z419 Encounter for procedure for purposes other than remedying health state, unspecified: Secondary | ICD-10-CM | POA: Diagnosis not present

## 2021-08-01 ENCOUNTER — Other Ambulatory Visit (HOSPITAL_COMMUNITY)
Admission: RE | Admit: 2021-08-01 | Discharge: 2021-08-01 | Disposition: A | Discharge status: Home / Self Care | Payer: Managed Care, Other (non HMO) | Source: Ambulatory Visit | Attending: Obstetrics and Gynecology | Admitting: Obstetrics and Gynecology

## 2021-08-01 ENCOUNTER — Other Ambulatory Visit: Payer: Self-pay

## 2021-08-01 ENCOUNTER — Encounter: Payer: Self-pay | Admitting: Obstetrics and Gynecology

## 2021-08-01 ENCOUNTER — Ambulatory Visit (INDEPENDENT_AMBULATORY_CARE_PROVIDER_SITE_OTHER): Payer: Managed Care, Other (non HMO) | Admitting: Obstetrics and Gynecology

## 2021-08-01 VITALS — BP 120/74 | HR 88 | Resp 16 | Ht 62.0 in | Wt 199.1 lb

## 2021-08-01 DIAGNOSIS — Z124 Encounter for screening for malignant neoplasm of cervix: Secondary | ICD-10-CM | POA: Insufficient documentation

## 2021-08-01 DIAGNOSIS — Z01419 Encounter for gynecological examination (general) (routine) without abnormal findings: Secondary | ICD-10-CM

## 2021-08-01 NOTE — Progress Notes (Signed)
HPI:      Ms. Katie Sanders is a 31 y.o. G1P1 who LMP was No LMP recorded.  Subjective:   She presents today for her annual examination.  She reports no complaints.  She is not currently sexually active and has no immediate future plans for sexual activity.  She is currently abstinent.  She does not desire birth control.  She is having normal regular menses. Significant note, patient takes methotrexate for arthritis.    Hx: The following portions of the patient's history were reviewed and updated as appropriate:             She  has a past medical history of Rheumatoid arthritis ([facility]) and Seasonal allergies. She does not have any pertinent problems on file. She  has a past surgical history that includes Hernia repair. Her family history includes Hypertension in her father and mother; Thyroid disease in her mother. She  reports that she has never smoked. She has never used smokeless tobacco. She reports current alcohol use. She reports that she does not use drugs. She has a current medication list which includes the following prescription(s): acetaminophen, humira pen, cholecalciferol, fexofenadine hcl, folic acid, and methotrexate. She has No Known Allergies.       Review of Systems:  Review of Systems  Constitutional: Denied constitutional symptoms, night sweats, recent illness, fatigue, fever, insomnia and weight loss.  Eyes: Denied eye symptoms, eye pain, photophobia, vision change and visual disturbance.  Ears/Nose/Throat/Neck: Denied ear, nose, throat or neck symptoms, hearing loss, nasal discharge, sinus congestion and sore throat.  Cardiovascular: Denied cardiovascular symptoms, arrhythmia, chest pain/pressure, edema, exercise intolerance, orthopnea and palpitations.  Respiratory: Denied pulmonary symptoms, asthma, pleuritic pain, productive sputum, cough, dyspnea and wheezing.  Gastrointestinal: Denied, gastro-esophageal reflux, melena, nausea and vomiting.  Genitourinary: Denied  genitourinary symptoms including symptomatic vaginal discharge, pelvic relaxation issues, and urinary complaints.  Musculoskeletal: Denied musculoskeletal symptoms, stiffness, swelling, muscle weakness and myalgia.  Dermatologic: Denied dermatology symptoms, rash and scar.  Neurologic: Denied neurology symptoms, dizziness, headache, neck pain and syncope.  Psychiatric: Denied psychiatric symptoms, anxiety and depression.  Endocrine: Denied endocrine symptoms including hot flashes and night sweats.   Meds:   Current Outpatient Medications on File Prior to Visit  Medication Sig Dispense Refill   acetaminophen (TYLENOL) 650 MG CR tablet Take by mouth.     Adalimumab (HUMIRA PEN) 40 MG/0.4ML PNKT INJECT 1 PEN UNDER THE SKIN EVERY 14 DAYS. 2 each 6   Cholecalciferol 25 MCG (1000 UT) tablet Take 1 tablet (1,000 Units total) by mouth daily. 90 tablet 1   Fexofenadine HCl (ALLEGRA PO) Take by mouth as needed.       folic acid (FOLVITE) 1 MG tablet Take 2 tablets (2 mg total) by mouth daily. 180 tablet 1   methotrexate (RHEUMATREX) 2.5 MG tablet Take 8 tablets (20 mg total) by mouth once a week. 40 tablet 2   No current facility-administered medications on file prior to visit.    Objective:     Vitals:   08/01/21 1015  BP: 120/74  Pulse: 88  Resp: 16    Filed Weights   08/01/21 1015  Weight: 199 lb 1.6 oz (90.3 kg)              Physical examination General NAD, Conversant  HEENT Atraumatic; Op clear with mmm.  Normo-cephalic. Pupils reactive. Anicteric sclerae  Thyroid/Neck Smooth without nodularity or enlargement. Normal ROM.  Neck Supple.  Skin No rashes, lesions or ulceration. Normal palpated skin  turgor. No nodularity.  Breasts: No masses or discharge.  Symmetric.  No axillary adenopathy.  Lungs: Clear to auscultation.No rales or wheezes. Normal Respiratory effort, no retractions.  Heart: NSR.  No murmurs or rubs appreciated. No periferal edema  Abdomen: Soft.  Non-tender.  No  masses.  No HSM. No hernia  Extremities: Moves all appropriately.  Normal ROM for age. No lymphadenopathy.  Neuro: Oriented to PPT.  Normal mood. Normal affect.     Pelvic:   Vulva: Normal appearance.  No lesions.  Vagina: No lesions or abnormalities noted.  Support: Second-degree uterine descensus  Urethra No masses tenderness or scarring.  Meatus Normal size without lesions or prolapse.  Cervix: Normal appearance.  No lesions.  Anus: Normal exam.  No lesions.  Perineum: Normal exam.  No lesions.        Bimanual   Uterus: Normal size.  Non-tender.  Mobile.  AV.  Adnexae: No masses.  Non-tender to palpation.  Cul-de-sac: Negative for abnormality.     Assessment:    G1P1 Patient Active Problem List   Diagnosis Date Noted   Vitamin D deficiency 05/17/2021   Routine screening for STI (sexually transmitted infection) 05/17/2021   Depression 08/26/2019   Obesity (BMI 35.0-39.9 without comorbidity) 09/08/2018   Positive ANA (antinuclear antibody) 08/14/2018   Rheumatoid arthritis involving multiple sites with positive rheumatoid factor ([place]) 08/14/2018   High risk medication use 08/14/2018     1. Encounter for well woman exam with routine gynecological exam   2. Cervical cancer screening        Plan:            1.  Basic Screening Recommendations The basic screening recommendations for asymptomatic women were discussed with the patient during her visit.  The age-appropriate recommendations were discussed with her and the rational for the tests reviewed.  When I am informed by the patient that another primary care physician has previously obtained the age-appropriate tests and they are up-to-date, only outstanding tests are ordered and referrals given as necessary.  Abnormal results of tests will be discussed with her when all of her results are completed.  Routine preventative health maintenance measures emphasized: Exercise/Diet/Weight control, Tobacco Warnings,  Alcohol/Substance use risks and Stress Management Pap performed Orders No orders of the defined types were placed in this encounter.   No orders of the defined types were placed in this encounter.         F/U  Return in about 1 year (around 08/01/2022) for Annual Physical.  Finis Bud, M.D. 08/01/2021 10:47 AM

## 2021-08-02 LAB — CYTOLOGY - PAP
Comment: NEGATIVE
Diagnosis: NEGATIVE
High risk HPV: NEGATIVE

## 2021-08-15 NOTE — Progress Notes (Signed)
Office Visit Note  Patient: Katie Sanders             Date of Birth: June 07, 1991           MRN: [id number]             PCP: Pcp, No Referring: No ref. provider found Visit Date: 08/16/2021   Subjective:  Pain of the Right Hand, Pain of the Left Hand, Pain of the Right Elbow, Pain of the Right Knee, Pain of the Left Knee, and feeling sluggish and tired    History of Present Illness: Katie Sanders is a 31 y.o. female here for follow up for seropositive RA on Humira 40 mg [place] q14days and MTX 20 mg PO weekly and folic acid 2 mg daily. Labs at last visit showing mild leukopenia at 3.2 and vitamin D insufficiency at 21 recommending starting 1000 units daily supplementation. She has noticed some increased pain in bilateral knees most often worse with climbing stairs or getting up from the toilet or very low seats. She also has some right elbow pain but does not recall any injury or particular motion affecting this. She is also more tired overall with low energy despite no change in sleep.  Previous HPI 05/17/21 [name] is a 31 y.o. 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 She is transferring care from [facility] rheumatology to here due to relocation.  Symptoms started with joint pain particularly involving her bilateral hands and knees since 5 or 6 years ago but she mostly ignored these until severity increased evaluation for this started treatment with rheumatology in about 2020 initially hydroxychloroquine was not effective or not well-tolerated he subsequently started methotrexate and then after incomplete response also started Humira.  Disease has been well controlled on this regimen although she did experience missing her Humira treatment for an extra approximately 2 weeks due to waiting on prior authorization for prescription refill causing some symptom exacerbation and she took a short steroid  taper about a month ago due to this. She has experienced some hair thinning with the methotrexate treatment but no other major side effect problems since increasing to 2 mg of folic acid daily.  She also describes a problem of brain fog with concentration difficulties she feels like she has attention deficit problems that this is worsened during the past 2 years.  This did not correlate in particular with a change to third shift work, although her sleep has been more disrupted with this schedule change. Previous hand x-rays did not demonstrate any erosive disease changes.  She does not recall having any prior chest x-ray.   Labs  09/2020 RF+ CCP+ ANA 1:320   08/2019 HBV neg HCV neg HIV neg   12/2020 CBC wnl CMP wnl   Review of Systems  Constitutional:  Positive for fatigue.  Musculoskeletal:  Positive for joint pain, joint pain and morning stiffness. Negative for joint swelling.  Skin:  Negative for rash.  Neurological:  Negative for numbness and weakness.   PMFS History:  Patient Active Problem List   Diagnosis Date Noted   Other fatigue 08/16/2021   Patellofemoral pain syndrome of both knees 08/16/2021   Vitamin D deficiency 05/17/2021   Routine screening for STI (sexually transmitted infection) 05/17/2021   Depression 08/26/2019   Obesity (BMI 35.0-39.9 without comorbidity) 09/08/2018   Positive ANA (antinuclear antibody) 08/14/2018   Rheumatoid arthritis involving multiple sites  with positive rheumatoid factor ([place]) 08/14/2018   High risk medication use 08/14/2018    Past Medical History:  Diagnosis Date   Rheumatoid arthritis ([place])    Seasonal allergies     Family History  Problem Relation Age of Onset   Hypertension Mother    Thyroid disease Mother    Hypertension Father    Past Surgical History:  Procedure Laterality Date   HERNIA REPAIR     Social History   Social History Narrative   Not on file   Immunization History  Administered Date(s) Administered    Influenza,inj,Quad PF,6+ Mos 06/21/2014   PFIZER(Purple Top)SARS-COV-2 Vaccination 07/27/2019, 09/06/2019   Pneumococcal Conjugate-13 11/20/2018   Pneumococcal Polysaccharide-23 02/22/2019   Tdap 09/20/2014     Objective: Vital Signs: BP 123/85    Pulse 75    Ht 5\' 2"  (1.575 m)    Wt 199 lb 9.6 oz (90.5 kg)    BMI 36.51 kg/m    Physical Exam Constitutional:      Appearance: She is obese.  Cardiovascular:     Rate and Rhythm: Normal rate and regular rhythm.  Pulmonary:     Effort: Pulmonary effort is normal.     Breath sounds: Normal breath sounds.  Musculoskeletal:     Right lower leg: No edema.     Left lower leg: No edema.  Skin:    General: Skin is warm and dry.     Findings: No rash.  Neurological:     General: No focal deficit present.     Mental Status: She is alert.     Deep Tendon Reflexes: Reflexes normal.  Psychiatric:        Mood and Affect: Mood normal.     Musculoskeletal Exam:  Neck full ROM no tenderness Shoulders full ROM no tenderness or swelling Right elbow tenderness to palpation over the olecranon process and lateral epicondyle no warmth or swelling and full range of motion is intact Wrists full ROM no tenderness or swelling Fingers full ROM no tenderness or swelling Knees have full range of motion bilaterally, tenderness to palpation over the patellar tendon on both sides there is no swelling warmth or erythema Ankles full ROM no tenderness or swelling  CDAI Exam: CDAI Score: 6  Patient Global: 20 mm; Provider Global: 10 mm Swollen: 0 ; Tender: 3  Joint Exam 08/16/2021      Right  Left  Elbow   Tender     Knee   Tender   Tender     Investigation: No additional findings.  Imaging: No results found.  Recent Labs: Lab Results  Component Value Date   WBC 3.2 (L) 05/17/2021   HGB 12.3 05/17/2021   PLT 254 05/17/2021   NA 140 05/17/2021   K 4.2 05/17/2021   CL 105 05/17/2021   CO2 28 05/17/2021   GLUCOSE 79 05/17/2021   BUN 10  05/17/2021   CREATININE 0.73 05/17/2021   BILITOT 0.3 05/17/2021   ALKPHOS 69 11/15/2012   AST 17 05/17/2021   ALT 23 05/17/2021   PROT 6.9 05/17/2021   ALBUMIN 3.8 11/15/2012   CALCIUM 9.4 05/17/2021   GFRAA >60 11/15/2012   QFTBGOLDPLUS NEGATIVE 05/17/2021    Speciality Comments: No specialty comments available.  Procedures:  No procedures performed Allergies: Patient has no known allergies.   Assessment / Plan:     Visit Diagnoses: Rheumatoid arthritis involving multiple sites with positive rheumatoid factor ([place]) - Plan: Sedimentation rate, C-reactive protein  RA appears to be  well controlled there is no synovitis on exam.  Checking sed rate and CRP today for disease activity monitoring.  Not sure about her right elbow pain there is no obvious inflammatory changes we will just monitor this for now.  Plan to continue methotrexate 20 mg p.o. weekly folic acid 2 mg daily and Humira 40 mg subcu q. 14 days.  Other fatigue - Plan: B12 and Folate Panel  Persistent fatigue sluggishness and tired feeling without a specific cause.  I do not think this is a medication effect since there was no recent change.  Checking for disease activity as above.  We will also check for vitamin B12 and folate deficiency as possible underlying causes.  High risk medication use - Plan: CBC with Differential/Platelet, COMPLETE METABOLIC PANEL WITH GFR  Checking CBC and CMP for methotrexate and Humira medication monitoring today.  No interval side effects or adverse events.  Patellofemoral pain syndrome of both knees - Plan: Naproxen Sodium 220 MG CAPS  Current knee pain appears most consistent with patellar tendinitis or patellofemoral pain syndrome.  There is no significant crepitus or evidence of patellofemoral joint arthritis or chondromalacia patellae.  I recommend a use of scheduled low-dose NSAID medication for the next 2 weeks trying to avoid provocative activities.  Provided letter recommending  accommodations for higher seated facilities at her home if this cannot be adjusted then discussed using a commercial toilet seat lift.  Recommended resuming some type of quadricep strengthening exercises when pain is less.  Provided printed information to review.  Orders: Orders Placed This Encounter  Procedures   Sedimentation rate   C-reactive protein   CBC with Differential/Platelet   COMPLETE METABOLIC PANEL WITH GFR   B12 and Folate Panel   Meds ordered this encounter  Medications   Naproxen Sodium 220 MG CAPS    Sig: Take by mouth 1-2 times daily     Follow-Up Instructions: Return in about 3 months (around 11/14/2021) for RA on MTX/ADA f/u 49mos.   Collier Salina, MD  Note - This record has been created using [employer].  Chart creation errors have been sought, but may not always  have been located. Such creation errors do not reflect on  the standard of medical care.

## 2021-08-16 ENCOUNTER — Ambulatory Visit (INDEPENDENT_AMBULATORY_CARE_PROVIDER_SITE_OTHER): Payer: Managed Care, Other (non HMO) | Admitting: Internal Medicine

## 2021-08-16 ENCOUNTER — Other Ambulatory Visit: Payer: Self-pay

## 2021-08-16 ENCOUNTER — Encounter: Payer: Self-pay | Admitting: Internal Medicine

## 2021-08-16 VITALS — BP 123/85 | HR 75 | Ht 62.0 in | Wt 199.6 lb

## 2021-08-16 DIAGNOSIS — Z79899 Other long term (current) drug therapy: Secondary | ICD-10-CM

## 2021-08-16 DIAGNOSIS — M222X1 Patellofemoral disorders, right knee: Secondary | ICD-10-CM

## 2021-08-16 DIAGNOSIS — M222X2 Patellofemoral disorders, left knee: Secondary | ICD-10-CM

## 2021-08-16 DIAGNOSIS — R5383 Other fatigue: Secondary | ICD-10-CM | POA: Diagnosis not present

## 2021-08-16 DIAGNOSIS — M0579 Rheumatoid arthritis with rheumatoid factor of multiple sites without organ or systems involvement: Secondary | ICD-10-CM

## 2021-08-16 MED ORDER — NAPROXEN SODIUM 220 MG PO CAPS: ORAL_CAPSULE | ORAL | Status: AC

## 2021-08-16 NOTE — Patient Instructions (Signed)
I recommend taking naproxen 220mg  (Aleve, Naprosyn) 1-2 times daily for next 2 weeks for patellofemoral pain to calm down current inflammation.  We can continue current RA medications and checking labs today.

## 2021-08-17 LAB — CBC WITH DIFFERENTIAL/PLATELET
Absolute Monocytes: 219 cells/uL (ref 200–950)
Basophils Absolute: 30 cells/uL (ref 0–200)
Basophils Relative: 0.7 %
Eosinophils Absolute: 151 cells/uL (ref 15–500)
Eosinophils Relative: 3.5 %
HCT: 37.4 % (ref 35.0–45.0)
Hemoglobin: 12.3 g/dL (ref 11.7–15.5)
Lymphs Abs: 1432 cells/uL (ref 850–3900)
MCH: 31.7 pg (ref 27.0–33.0)
MCHC: 32.9 g/dL (ref 32.0–36.0)
MCV: 96.4 fL (ref 80.0–100.0)
MPV: 11 fL (ref 7.5–12.5)
Monocytes Relative: 5.1 %
Neutro Abs: 2468 cells/uL (ref 1500–7800)
Neutrophils Relative %: 57.4 %
Platelets: 296 10*3/uL (ref 140–400)
RBC: 3.88 10*6/uL (ref 3.80–5.10)
RDW: 13.5 % (ref 11.0–15.0)
Total Lymphocyte: 33.3 %
WBC: 4.3 10*3/uL (ref 3.8–10.8)

## 2021-08-17 LAB — COMPLETE METABOLIC PANEL WITH GFR
AG Ratio: 1.4 (calc) (ref 1.0–2.5)
ALT: 37 U/L — ABNORMAL HIGH (ref 6–29)
AST: 21 U/L (ref 10–30)
Albumin: 4.2 g/dL (ref 3.6–5.1)
Alkaline phosphatase (APISO): 66 U/L (ref 31–125)
BUN: 11 mg/dL (ref 7–25)
CO2: 30 mmol/L (ref 20–32)
Calcium: 9.2 mg/dL (ref 8.6–10.2)
Chloride: 103 mmol/L (ref 98–110)
Creat: 0.68 mg/dL (ref 0.50–0.97)
Globulin: 3.1 g/dL (calc) (ref 1.9–3.7)
Glucose, Bld: 82 mg/dL (ref 65–99)
Potassium: 4.4 mmol/L (ref 3.5–5.3)
Sodium: 138 mmol/L (ref 135–146)
Total Bilirubin: 0.4 mg/dL (ref 0.2–1.2)
Total Protein: 7.3 g/dL (ref 6.1–8.1)
eGFR: 119 mL/min/{1.73_m2} (ref >=60)

## 2021-08-17 LAB — SEDIMENTATION RATE: Sed Rate: 22 mm/h — ABNORMAL HIGH (ref 0–20)

## 2021-08-17 LAB — B12 AND FOLATE PANEL
Folate: 17.1 ng/mL
Vitamin B-12: 521 pg/mL (ref 200–1100)

## 2021-08-17 LAB — C-REACTIVE PROTEIN: CRP: 1.7 mg/L (ref <8.0)

## 2021-08-17 NOTE — Progress Notes (Signed)
Sedimentation rate is 22 this is slightly increased so there may be some inflammation activity. B12 and folate are normal so I don't recommend any specific extra supplementation. Her ALT one of the liver function tests is very slightly abnormal. I don't think anything has to be done immediately for such a small change, but if this stays elevated or gets worse going forwards we would need to change her methotrexate.

## 2021-08-22 DIAGNOSIS — Z419 Encounter for procedure for purposes other than remedying health state, unspecified: Secondary | ICD-10-CM | POA: Diagnosis not present

## 2021-09-19 DIAGNOSIS — Z419 Encounter for procedure for purposes other than remedying health state, unspecified: Secondary | ICD-10-CM | POA: Diagnosis not present

## 2021-09-28 ENCOUNTER — Other Ambulatory Visit: Payer: Self-pay | Admitting: Internal Medicine

## 2021-09-28 DIAGNOSIS — M0579 Rheumatoid arthritis with rheumatoid factor of multiple sites without organ or systems involvement: Secondary | ICD-10-CM

## 2021-09-28 MED ORDER — METHOTREXATE 2.5 MG PO TABS: 20.0000 mg | ORAL_TABLET | ORAL | 0 refills | Status: DC

## 2021-09-28 NOTE — Telephone Encounter (Signed)
Next Visit: 11/14/2021 ? ?Last Visit: 08/16/2021 ? ?Last Fill: 05/17/2021 ? ?DX: Rheumatoid arthritis involving multiple sites with positive rheumatoid factor  ? ?Current Dose per office note on 08/16/2021: folic acid 2 mg daily ? ?Okay to refill folic acid?  ?

## 2021-09-28 NOTE — Telephone Encounter (Signed)
Next Visit: 11/14/2021 ? ?Last Visit: 08/16/2021 ? ?Last Fill: 05/17/2021 ? ?DX: Rheumatoid arthritis involving multiple sites with positive rheumatoid factor  ? ?Current Dose per office note 08/16/2021:  methotrexate 20 mg p.o. weekly  ? ?Labs: 08/16/2021, Sedimentation rate is 22 this is slightly increased so there may be some inflammation activity. B12 and folate are normal so I don't recommend any specific extra supplementation. ?Her ALT one of the liver function tests is very slightly abnormal. I don't think anything has to be done immediately for such a small change, but if this stays elevated or gets worse going forwards we would need to change her methotrexate. ? ?Okay to refill MTX? ? ?

## 2021-09-28 NOTE — Telephone Encounter (Signed)
Patient called the office requesting a refill of Methotrexate 2.5mg  [facility] [address] in [place]. Patient would like a higher quantity then the 32 tablets she normally gets if possible. Patient states she is out of medication and due to take it today. Patient would like prednisone if it can't be filled today.  ?

## 2021-10-20 DIAGNOSIS — Z419 Encounter for procedure for purposes other than remedying health state, unspecified: Secondary | ICD-10-CM | POA: Diagnosis not present

## 2021-11-13 NOTE — Progress Notes (Signed)
? ?Office Visit Note ? ?Patient: Katie Sanders             ?Date of Birth: Nov 23, 1990           ?MRN: [id number]             ?PCP: Pcp, No ?Referring: No ref. provider found ?Visit Date: 11/14/2021 ? ? ?Subjective:  ?Rheumatoid Arthritis (Chest pain, bil knee and bil hands swelling and pain) ? ? ?History of Present Illness: Katie Sanders is a 31 y.o. 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 Overall doing okay but has some episodes of left sided upper chest pain. Pain does not radiate, not associated with shortness of breath or sweating or palpitations. This is not provoked with positional change or any specific physical activity. This came and went repeatedly but is not acting up today. Joint pain and swelling is coming and going worst in hands, feet, and knees also some elbow pains. This is not too limiting and she notices some symptom variation between Humira doses. Her fatigue is the worst symptom. ? ?Previous HPI ?08/16/21 ?Katie Sanders is a 31 y.o. female here for follow up for seropositive RA on Humira 40 mg [place] q14days and MTX 20 mg PO weekly and folic acid 2 mg daily. Labs at last visit showing mild leukopenia at 3.2 and vitamin D insufficiency at 21 recommending starting 1000 units daily supplementation. She has noticed some increased pain in bilateral knees most often worse with climbing stairs or getting up from the toilet or very low seats. She also has some right elbow pain but does not recall any injury or particular motion affecting this. She is also more tired overall with low energy despite no change in sleep. ?  ?Previous HPI ?05/17/21 ?Katie Sanders is a 31 y.o. 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 She is transferring care from [facility] rheumatology to here due to relocation.  Symptoms started with joint pain particularly involving her  bilateral hands and knees since 5 or 6 years ago but she mostly ignored these until severity increased evaluation for this started treatment with rheumatology in about 2020 initially hydroxychloroquine was not effective or not well-tolerated he subsequently started methotrexate and then after incomplete response also started Humira.  Disease has been well controlled on this regimen although she did experience missing her Humira treatment for an extra approximately 2 weeks due to waiting on prior authorization for prescription refill causing some symptom exacerbation and she took a short steroid taper about a month ago due to this. ?She has experienced some hair thinning with the methotrexate treatment but no other major side effect problems since increasing to 2 mg of folic acid daily.  She also describes a problem of brain fog with concentration difficulties she feels like she has attention deficit problems that this is worsened during the past 2 years.  This did not correlate in particular with a change to third shift work, although her sleep has been more disrupted with this schedule change. ?Previous hand x-rays did not demonstrate any erosive disease changes.  She does not recall having any prior chest x-ray. ?  ?Labs  ?09/2020 ?RF+ ?CCP+ ?ANA 1:320 ?  ?08/2019 ?HBV neg ?HCV neg ?HIV neg ?  ?12/2020 ?CBC wnl ?CMP wnl ?  ? ? ?Review of Systems  ?Constitutional:  Positive  for fatigue.  ?HENT:  Negative for mouth dryness.   ?Eyes:  Negative for dryness.  ?Respiratory:  Negative for shortness of breath.   ?Cardiovascular:  Positive for chest pain.  ?Gastrointestinal:  Negative for constipation.  ?Endocrine: Negative for excessive thirst.  ?Genitourinary:  Negative for difficulty urinating.  ?Musculoskeletal:  Positive for joint pain, gait problem, joint pain, joint swelling and morning stiffness.  ?Skin:  Negative for rash.  ?Allergic/Immunologic: Negative for susceptible to infections.  ?Neurological:  Negative for  numbness.  ?Hematological:  Negative for bruising/bleeding tendency.  ?Psychiatric/Behavioral:  Negative for sleep disturbance.   ? ?PMFS History:  ?Patient Active Problem List  ? Diagnosis Date Noted  ? Other fatigue 08/16/2021  ? Patellofemoral pain syndrome of both knees 08/16/2021  ? Vitamin D deficiency 05/17/2021  ? Routine screening for STI (sexually transmitted infection) 05/17/2021  ? Depression 08/26/2019  ? Obesity (BMI 35.0-39.9 without comorbidity) 09/08/2018  ? Positive ANA (antinuclear antibody) 08/14/2018  ? Rheumatoid arthritis involving multiple sites with positive rheumatoid factor ([place]) 08/14/2018  ? High risk medication use 08/14/2018  ?  ?Past Medical History:  ?Diagnosis Date  ? Rheumatoid arthritis ([place])   ? Seasonal allergies   ?  ?Family History  ?Problem Relation Age of Onset  ? Hypertension Mother   ? Thyroid disease Mother   ? Hypertension Father   ? ?Past Surgical History:  ?Procedure Laterality Date  ? HERNIA REPAIR    ? ?Social History  ? ?Social History Narrative  ? Not on file  ? ?Immunization History  ?Administered Date(s) Administered  ? Influenza,inj,Quad PF,6+ Mos 06/21/2014  ? PFIZER(Purple Top)SARS-COV-2 Vaccination 07/27/2019, 09/06/2019  ? Pneumococcal Conjugate-13 11/20/2018  ? Pneumococcal Polysaccharide-23 02/22/2019  ? Tdap 09/20/2014  ?  ? ?Objective: ?Vital Signs: BP 123/79 (BP Location: Left Arm, Patient Position: Sitting, Cuff Size: Normal)   Pulse 92   Resp 15   Ht 5' 2.5" (1.588 m)   Wt 202 lb (91.6 kg)   BMI 36.36 kg/m?   ? ?Physical Exam ?Constitutional:   ?   Appearance: She is obese.  ?Cardiovascular:  ?   Rate and Rhythm: Normal rate and regular rhythm.  ?Pulmonary:  ?   Effort: Pulmonary effort is normal.  ?   Breath sounds: Normal breath sounds.  ?Musculoskeletal:  ?   Right lower leg: No edema.  ?   Left lower leg: No edema.  ?Skin: ?   General: Skin is warm and dry.  ?Neurological:  ?   General: No focal deficit present.  ?   Mental Status: She is  alert.  ?Psychiatric:     ?   Mood and Affect: Mood normal.  ?  ? ?Musculoskeletal Exam:  ?Shoulders full ROM no tenderness or swelling ?Elbows full ROM no tenderness or swelling ?Wrists full ROM no tenderness or swelling ?Fingers full ROM, no palpable swelling, right thumb MCP joint tenderness without palpable effusion ?Knees full ROM no tenderness or swelling ?Right ankle anterior pain with pressure and with flexion and extension ? ?No chest wall tenderness to palpation, no pain with shoulder ROM ? ?CDAI Exam: ?CDAI Score: 6  ?Patient Global: 30 mm; Provider Global: 20 mm ?Swollen: 0 ; Tender: 2  ?Joint Exam 11/14/2021  ? ?   Right  Left  ?MCP 1   Tender     ?Ankle   Tender     ? ? ? ?Investigation: ?No additional findings. ? ?Imaging: ?No results found. ? ?Recent Labs: ?Lab Results  ?Component Value Date  ?  WBC 4.3 11/14/2021  ? HGB 11.6 (L) 11/14/2021  ? PLT 312 11/14/2021  ? NA 139 11/14/2021  ? K 3.8 11/14/2021  ? CL 104 11/14/2021  ? CO2 26 11/14/2021  ? GLUCOSE 82 11/14/2021  ? BUN 14 11/14/2021  ? CREATININE 0.73 11/14/2021  ? BILITOT 0.4 11/14/2021  ? ALKPHOS 69 11/15/2012  ? AST 18 11/14/2021  ? ALT 29 11/14/2021  ? PROT 7.8 11/14/2021  ? ALBUMIN 3.8 11/15/2012  ? CALCIUM 9.2 11/14/2021  ? GFRAA >60 11/15/2012  ? QFTBGOLDPLUS NEGATIVE 05/17/2021  ? ? ?Speciality Comments: No specialty comments available. ? ?Procedures:  ?No procedures performed ?Allergies: Patient has no known allergies.  ? ?Assessment / Plan:     ?Visit Diagnoses: Rheumatoid arthritis involving multiple sites with positive rheumatoid factor ([place]) - Plan: Sedimentation rate ? ?Symptoms appear mildly active at this time. Checking sed rate for activity monitoring, has been persistently somewhat elevated. Plan to continue methotrexate 20 mg PO weekly and folic acid 1 mg daily and Humira 40 mg [place] q14days. ? ?High risk medication use - Plan: CBC with Differential/Platelet, COMPLETE METABOLIC PANEL WITH GFR ? ?Checking CBC and CMP for  methotrexate and Humira toxicity monitoring. ? ?Other fatigue ? ?Fatigue remains worst symptom complaint more than joint pain limitation. Checking inflammatory markers for systemic disease activity monitoring. ? ?Orders: ?

## 2021-11-14 ENCOUNTER — Ambulatory Visit (INDEPENDENT_AMBULATORY_CARE_PROVIDER_SITE_OTHER): Payer: Managed Care, Other (non HMO) | Admitting: Internal Medicine

## 2021-11-14 ENCOUNTER — Encounter: Payer: Self-pay | Admitting: Internal Medicine

## 2021-11-14 VITALS — BP 123/79 | HR 92 | Resp 15 | Ht 62.5 in | Wt 202.0 lb

## 2021-11-14 DIAGNOSIS — Z79899 Other long term (current) drug therapy: Secondary | ICD-10-CM | POA: Diagnosis not present

## 2021-11-14 DIAGNOSIS — R5383 Other fatigue: Secondary | ICD-10-CM | POA: Diagnosis not present

## 2021-11-14 DIAGNOSIS — M0579 Rheumatoid arthritis with rheumatoid factor of multiple sites without organ or systems involvement: Secondary | ICD-10-CM | POA: Diagnosis not present

## 2021-11-15 LAB — COMPLETE METABOLIC PANEL WITH GFR
AG Ratio: 1.3 (calc) (ref 1.0–2.5)
ALT: 29 U/L (ref 6–29)
AST: 18 U/L (ref 10–30)
Albumin: 4.4 g/dL (ref 3.6–5.1)
Alkaline phosphatase (APISO): 64 U/L (ref 31–125)
BUN: 14 mg/dL (ref 7–25)
CO2: 26 mmol/L (ref 20–32)
Calcium: 9.2 mg/dL (ref 8.6–10.2)
Chloride: 104 mmol/L (ref 98–110)
Creat: 0.73 mg/dL (ref 0.50–0.97)
Globulin: 3.4 g/dL (calc) (ref 1.9–3.7)
Glucose, Bld: 82 mg/dL (ref 65–99)
Potassium: 3.8 mmol/L (ref 3.5–5.3)
Sodium: 139 mmol/L (ref 135–146)
Total Bilirubin: 0.4 mg/dL (ref 0.2–1.2)
Total Protein: 7.8 g/dL (ref 6.1–8.1)
eGFR: 112 mL/min/{1.73_m2} (ref >=60)

## 2021-11-15 LAB — CBC WITH DIFFERENTIAL/PLATELET
Absolute Monocytes: 219 cells/uL (ref 200–950)
Basophils Absolute: 22 cells/uL (ref 0–200)
Basophils Relative: 0.5 %
Eosinophils Absolute: 99 cells/uL (ref 15–500)
Eosinophils Relative: 2.3 %
HCT: 36.1 % (ref 35.0–45.0)
Hemoglobin: 11.6 g/dL — ABNORMAL LOW (ref 11.7–15.5)
Lymphs Abs: 1269 cells/uL (ref 850–3900)
MCH: 30.5 pg (ref 27.0–33.0)
MCHC: 32.1 g/dL (ref 32.0–36.0)
MCV: 95 fL (ref 80.0–100.0)
MPV: 10.5 fL (ref 7.5–12.5)
Monocytes Relative: 5.1 %
Neutro Abs: 2692 cells/uL (ref 1500–7800)
Neutrophils Relative %: 62.6 %
Platelets: 312 10*3/uL (ref 140–400)
RBC: 3.8 10*6/uL (ref 3.80–5.10)
RDW: 13.4 % (ref 11.0–15.0)
Total Lymphocyte: 29.5 %
WBC: 4.3 10*3/uL (ref 3.8–10.8)

## 2021-11-15 LAB — SEDIMENTATION RATE: Sed Rate: 28 mm/h — ABNORMAL HIGH (ref 0–20)

## 2021-11-16 NOTE — Progress Notes (Signed)
Lab results look okay for continuing medications. Her sed rate test is mildly increase which can indicate some active inflammation. I don't think we need to change anything right now unless symptoms get worse.

## 2021-11-19 DIAGNOSIS — Z419 Encounter for procedure for purposes other than remedying health state, unspecified: Secondary | ICD-10-CM | POA: Diagnosis not present

## 2021-11-20 ENCOUNTER — Other Ambulatory Visit: Payer: Self-pay | Admitting: Internal Medicine

## 2021-11-20 DIAGNOSIS — M0579 Rheumatoid arthritis with rheumatoid factor of multiple sites without organ or systems involvement: Secondary | ICD-10-CM

## 2021-11-20 NOTE — Telephone Encounter (Signed)
Next Visit: 02/28/2022 ? ?Last Visit: 11/14/2021 ? ?Last Fill: 05/21/2021 ? ?[id number] arthritis involving multiple sites with positive rheumatoid factor ? ?Current Dose per office note 11/14/2021: Humira 40 mg [place] q14days ? ?Labs: 11/14/2021 Lab results look okay for continuing medications ? ?TB Gold: 05/17/2021 Neg   ? ?Okay to refill Humira?  ?

## 2021-12-15 ENCOUNTER — Other Ambulatory Visit: Payer: Self-pay | Admitting: Internal Medicine

## 2021-12-15 DIAGNOSIS — M0579 Rheumatoid arthritis with rheumatoid factor of multiple sites without organ or systems involvement: Secondary | ICD-10-CM

## 2021-12-20 DIAGNOSIS — Z419 Encounter for procedure for purposes other than remedying health state, unspecified: Secondary | ICD-10-CM | POA: Diagnosis not present

## 2021-12-27 ENCOUNTER — Other Ambulatory Visit: Payer: Self-pay | Admitting: Internal Medicine

## 2021-12-27 DIAGNOSIS — E559 Vitamin D deficiency, unspecified: Secondary | ICD-10-CM

## 2022-01-04 IMAGING — CR DG CHEST 2V
2 series · 2 of 2 positions shown · non-contrast
Comparison: None.

CLINICAL DATA: Baseline chest x-ray.  Rheumatoid arthritis

EXAM:
CHEST - 2 VIEW

[w chest pa]
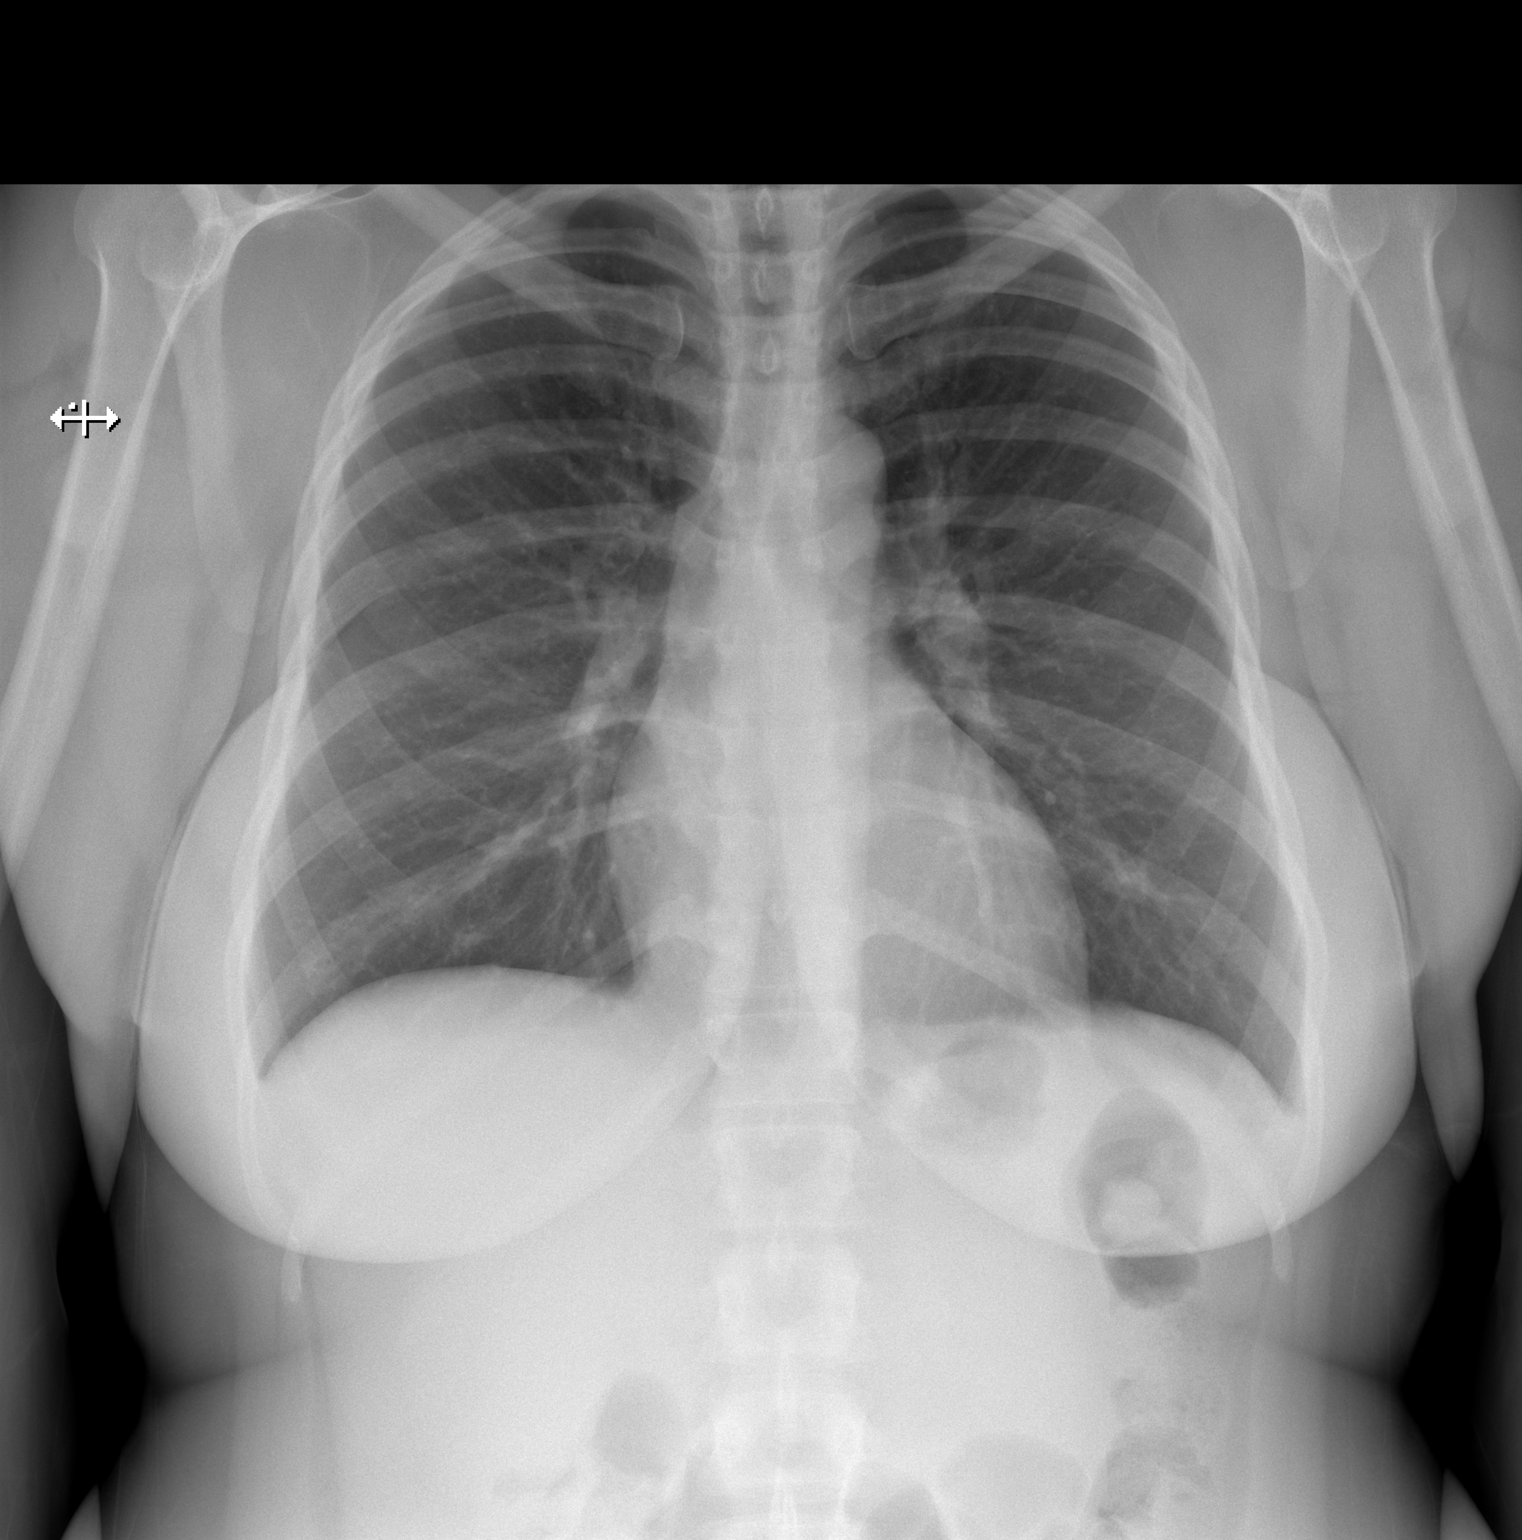

[w chest lat]
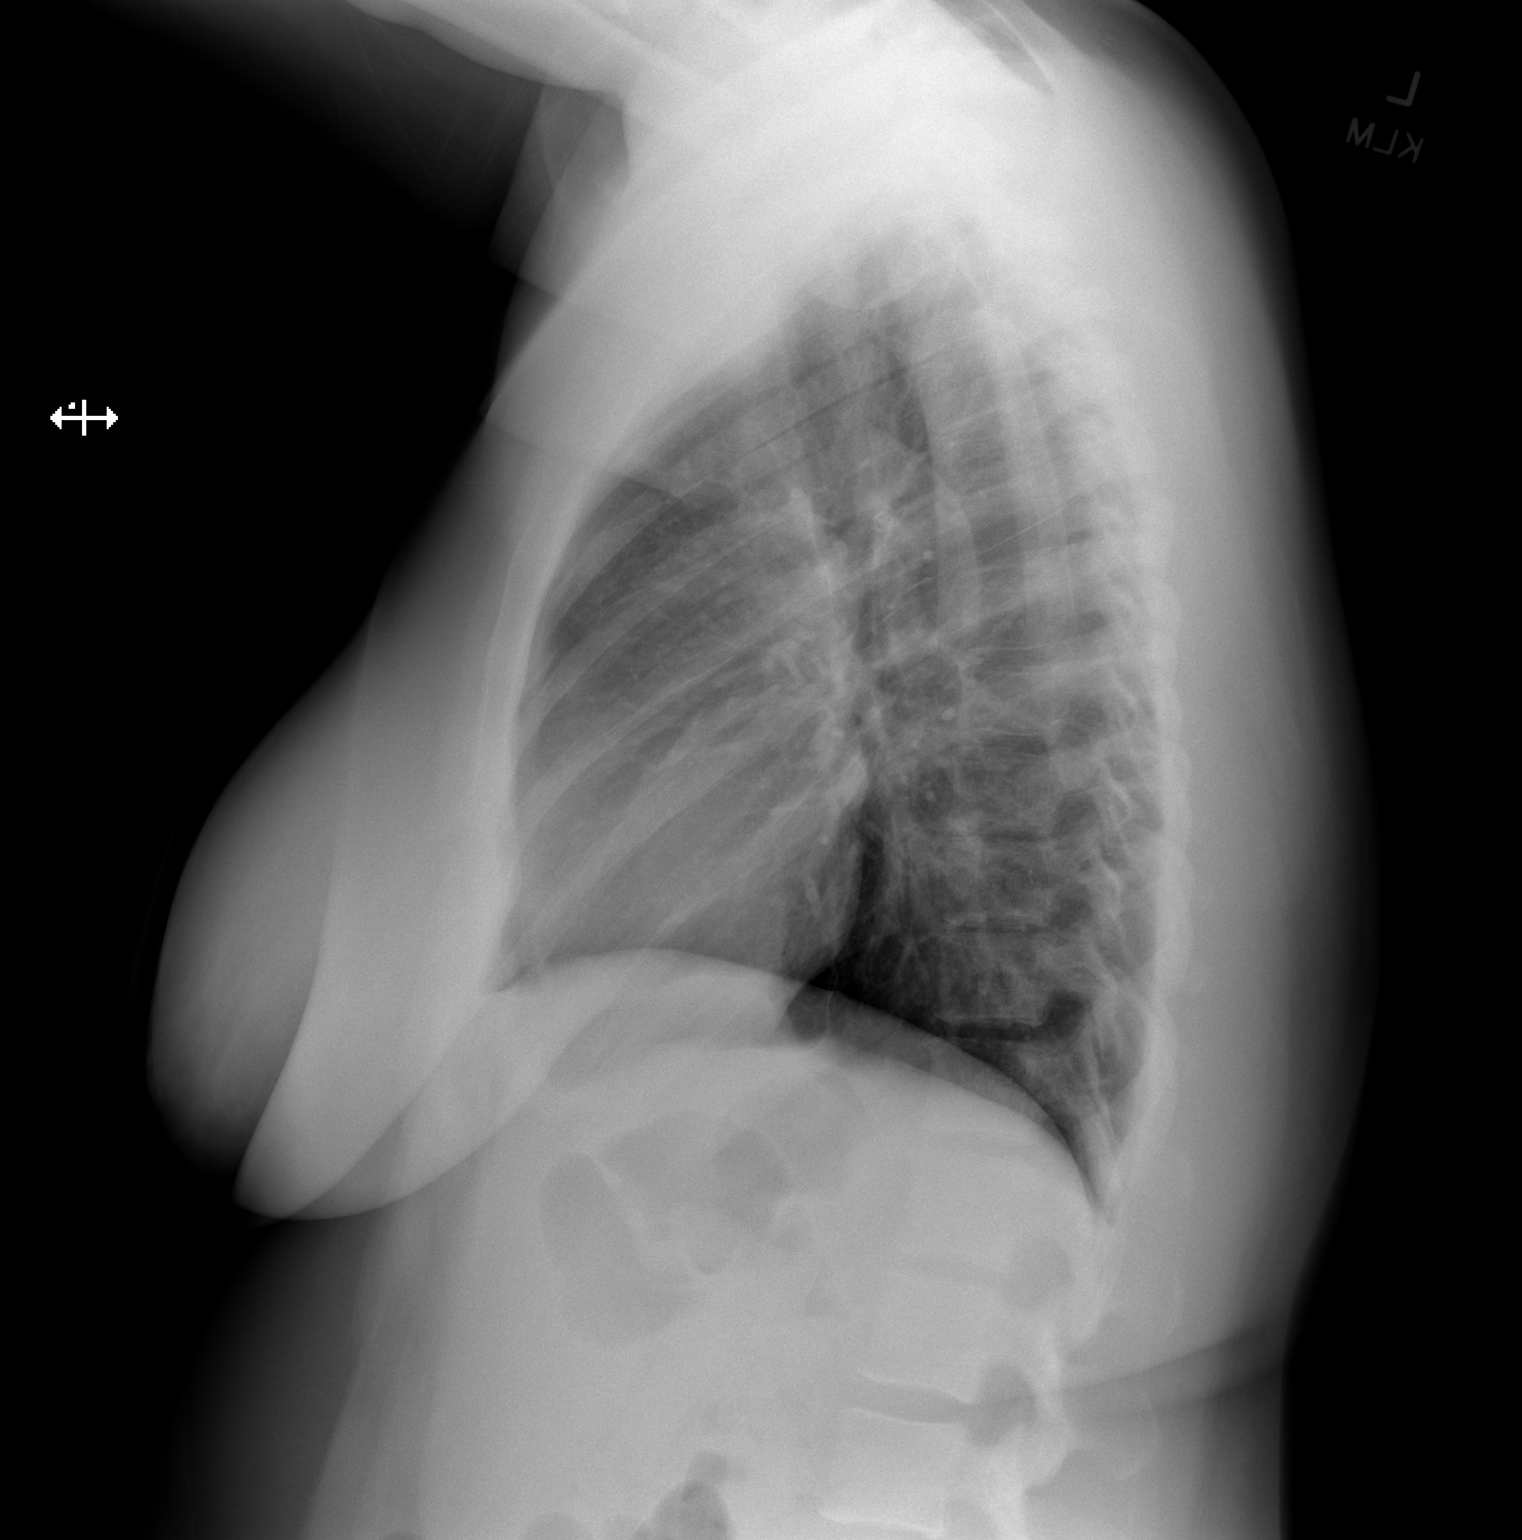

[2 of 2 positions shown; findings below may reference images not displayed]

FINDINGS: Heart size and mediastinal contours are within normal limits. No
suspicious pulmonary opacities identified.

No pleural effusion or pneumothorax visualized.

No acute osseous abnormality appreciated.
IMPRESSION: No acute intrathoracic process identified.

## 2022-01-19 DIAGNOSIS — Z419 Encounter for procedure for purposes other than remedying health state, unspecified: Secondary | ICD-10-CM | POA: Diagnosis not present

## 2022-02-18 ENCOUNTER — Encounter: Payer: Self-pay | Admitting: Family

## 2022-02-18 ENCOUNTER — Ambulatory Visit (INDEPENDENT_AMBULATORY_CARE_PROVIDER_SITE_OTHER): Payer: Managed Care, Other (non HMO) | Admitting: Family

## 2022-02-18 VITALS — BP 116/70 | HR 70 | Temp 98.6°F | Resp 16 | Ht 63.5 in | Wt 202.1 lb

## 2022-02-18 DIAGNOSIS — M0579 Rheumatoid arthritis with rheumatoid factor of multiple sites without organ or systems involvement: Secondary | ICD-10-CM

## 2022-02-18 DIAGNOSIS — Z13220 Encounter for screening for lipoid disorders: Secondary | ICD-10-CM | POA: Diagnosis not present

## 2022-02-18 DIAGNOSIS — D649 Anemia, unspecified: Secondary | ICD-10-CM | POA: Diagnosis not present

## 2022-02-18 DIAGNOSIS — G479 Sleep disorder, unspecified: Secondary | ICD-10-CM

## 2022-02-18 DIAGNOSIS — E559 Vitamin D deficiency, unspecified: Secondary | ICD-10-CM

## 2022-02-18 DIAGNOSIS — Z3009 Encounter for other general counseling and advice on contraception: Secondary | ICD-10-CM

## 2022-02-18 DIAGNOSIS — R5383 Other fatigue: Secondary | ICD-10-CM | POA: Diagnosis not present

## 2022-02-18 DIAGNOSIS — Z79899 Other long term (current) drug therapy: Secondary | ICD-10-CM

## 2022-02-18 DIAGNOSIS — E669 Obesity, unspecified: Secondary | ICD-10-CM

## 2022-02-18 NOTE — Assessment & Plan Note (Signed)
Continue methotrexate and humira as prescribed by rheumatologist.  Continue daily folic acid.  Cont f/u with rheumatology as scheduled Antiinflammatory diet

## 2022-02-18 NOTE — Patient Instructions (Addendum)
Welcome to our clinic, I am happy to have you as my new patient. I am excited to continue on this healthcare journey with you.  A referral was placed today for gynecology.  Please let us know if you have not heard back within 2 weeks about the referral.  Stop by the lab prior to leaving today. I will notify you of your results once received.   Please keep in mind Any my chart messages you send have up to a three business day turnaround for a response.  Phone calls may take up to a one full business day turnaround for a  response.   If you need a medication refill I recommend you request it through the pharmacy as this is easiest for [place] rather than sending a message and or phone call.   Due to recent changes in healthcare laws, you may see results of your imaging and/or laboratory studies on MyChart before I have had a chance to review them.  I understand that in some cases there may be results that are confusing or concerning to you. Please understand that not all results are received at the same time and often I may need to interpret multiple results in order to provide you with the best plan of care or course of treatment. Therefore, I ask that you please give me 2 business days to thoroughly review all your results before contacting my office for clarification. Should we see a critical lab result, you will be contacted sooner.   It was a pleasure seeing you today! Please do not hesitate to reach out with any questions and or concerns.  Regards,   Mort Sawyers FNP-C

## 2022-02-18 NOTE — Progress Notes (Signed)
New Patient Office Visit  Subjective:  Patient ID: Katie Sanders, female    DOB: 03/05/1991  Age: 31 y.o. MRN: [id number]  CC:  Chief Complaint  Patient presents with   Establish Care    HPI Katie Sanders is here to establish care as a new patient. Moved here from [place] , victims of [place] in [place].   Prior provider was: had a pcp over 1.5 years ago.  Seeing rheumatologist Dr. Garen Lah  Pt is without acute concerns.   Gyn: pap 08/01/21 negative  Tdap due 2026   Fatigue, chronic, feels like no energy.  Sleep at night with about six hours of sleep. Works from 530 pm to 2 am with [employer].   chronic concerns:  RA: on humira and methotrexate, overall tolerating well. Diagnosed seven years ago. Seeing Dr. Benjamine Mola, rheumatologist.   Past Medical History:  Diagnosis Date   Rheumatoid arthritis ([place])    Seasonal allergies     Past Surgical History:  Procedure Laterality Date   HERNIA REPAIR      Family History  Problem Relation Age of Onset   Hypertension Mother    Thyroid disease Mother    Anemia Mother    Hypertension Father    Anemia Sister        oldest   Kidney failure Brother    Hypertension Maternal Grandmother    Diabetes Maternal Grandmother    Hypertension Maternal Grandfather    Hypertension Paternal Grandmother    Breast cancer Paternal Grandmother        unknown age of onset   Hypertension Paternal Grandfather     Social History   Socioeconomic History   Marital status: Single    Spouse name: Not on file   Number of children: 1   Years of education: Not on file   Highest education level: Not on file  Occupational History    Employer: OTHER   Occupation: Mining engineer with [employer]  Tobacco Use   Smoking status: Never   Smokeless tobacco: Never  Vaping Use   Vaping Use: Never used  Substance and Sexual Activity   Alcohol use: Yes    Comment: 2 monthly   Drug use: No   Sexual activity: Yes     Partners: Male    Birth control/protection: Coitus interruptus  Other Topics Concern   Not on file  Social History Narrative   One 59 year old son    In a two year relationship with her s/o , female    Social Determinants of Health   Financial Resource Strain: Not on file  Food Insecurity: Not on file  Transportation Needs: Not on file  Physical Activity: Not on file  Stress: Not on file  Social Connections: Not on file  Intimate Partner Violence: Not on file    Outpatient Medications Prior to Visit  Medication Sig Dispense Refill   acetaminophen (TYLENOL) 650 MG CR tablet Take by mouth.     cholecalciferol (VITAMIN D) 25 MCG (1000 UNIT) tablet TAKE 1 TABLET BY MOUTH EVERY DAY 90 tablet 1   Fexofenadine HCl (ALLEGRA PO) Take by mouth as needed.       folic acid (FOLVITE) 1 MG tablet TAKE 2 TABLETS BY MOUTH EVERY DAY 180 tablet 1   HUMIRA PEN 40 MG/0.4ML PNKT INJECT 1 PEN UNDER THE SKIN EVERY 14 DAYS. 2 each 5   methotrexate (RHEUMATREX) 2.5 MG tablet TAKE 8 TABLETS (20 MG TOTAL) BY MOUTH ONCE A WEEK. [place]  tablet 0   No facility-administered medications prior to visit.    No Known Allergies      Objective:    Physical Exam  Gen: NAD, resting comfortably CV: RRR with no murmurs appreciated Pulm: NWOB, CTAB with no crackles, wheezes, or rhonchi Skin: warm, dry Psych: Normal affect and thought content  BP 116/70   Pulse 70   Temp 98.6 F (37 C)   Resp 16   Ht 5' 3.5" (1.613 m)   Wt 202 lb 2 oz (91.7 kg)   LMP 02/18/2022   SpO2 97%   BMI 35.24 kg/m  Wt Readings from Last 3 Encounters:  02/18/22 202 lb 2 oz (91.7 kg)  11/14/21 202 lb (91.6 kg)  08/16/21 199 lb 9.6 oz (90.5 kg)     Health Maintenance Due  Topic Date Due   COVID-19 Vaccine (3 - Pfizer risk series) 10/04/2019    There are no preventive care reminders to display for this patient.  No results found for: "TSH" Lab Results  Component Value Date   WBC 4.3 11/14/2021   HGB 11.6 (L)  11/14/2021   HCT 36.1 11/14/2021   MCV 95.0 11/14/2021   PLT 312 11/14/2021   Lab Results  Component Value Date   NA 139 11/14/2021   K 3.8 11/14/2021   CO2 26 11/14/2021   GLUCOSE 82 11/14/2021   BUN 14 11/14/2021   CREATININE 0.73 11/14/2021   BILITOT 0.4 11/14/2021   ALKPHOS 69 11/15/2012   AST 18 11/14/2021   ALT 29 11/14/2021   PROT 7.8 11/14/2021   ALBUMIN 3.8 11/15/2012   CALCIUM 9.2 11/14/2021   ANIONGAP 6 (L) 11/15/2012   EGFR 112 11/14/2021   No results found for: "CHOL" No results found for: "HDL" No results found for: "LDLCALC" No results found for: "TRIG" No results found for: "CHOLHDL" No results found for: "HGBA1C"    Assessment & Plan:   Problem List Items Addressed This Visit       Musculoskeletal and Integument   Rheumatoid arthritis involving multiple sites with positive rheumatoid factor ([facility])    Continue methotrexate and humira as prescribed by rheumatologist.  Continue daily folic acid.  Cont f/u with rheumatology as scheduled Antiinflammatory diet        Other   Obesity (BMI 35.0-39.9 without comorbidity)    Work on diet and exercise Ordering tsh pending results      High risk medication use    D/w pt use of double protection to prevent pregnancy       Vitamin D deficiency    Continue otc supplement vitamin d Ordering vitamin d today pending results      Relevant Orders   VITAMIN D 25 Hydroxy (Vit-D Deficiency, Fractures)   Sleep disorder    Work on good sleep hygiene Consider sleep study if no improvement.  Is on odd hours with change in shift work coming up which may help as she will no longer be nights.      Other fatigue - Primary    Fatigue w/u with labs, pending results      Relevant Orders   B12 and Folate Panel   TSH   Anemia    Ordering cbc ibc ferritin pending results      Relevant Orders   B12 and Folate Panel   IBC + Ferritin   CBC   Encounter for female family planning counseling    Referral to  obgyn  Due for pap  Relevant Orders   Ambulatory referral to Obstetrics / Gynecology   Other Visit Diagnoses     Screening for lipoid disorders       Relevant Orders   Lipid panel       No orders of the defined types were placed in this encounter.   Follow-up: Return in about 1 year (around 02/19/2023) for annually or prn .    Eugenia Pancoast, FNP

## 2022-02-18 NOTE — Assessment & Plan Note (Signed)
Work on good sleep hygiene Consider sleep study if no improvement.  Is on odd hours with change in shift work coming up which may help as she will no longer be nights.

## 2022-02-18 NOTE — Assessment & Plan Note (Signed)
Referral to obgyn  Due for pap

## 2022-02-18 NOTE — Assessment & Plan Note (Signed)
Fatigue w/u with labs, pending results

## 2022-02-18 NOTE — Assessment & Plan Note (Signed)
D/w pt use of double protection to prevent pregnancy

## 2022-02-18 NOTE — Progress Notes (Signed)
Office Visit Note  Patient: Katie Sanders             Date of Birth: [birth year]           MRN: [id number]             PCP: Mort Sawyers, FNP Referring: No ref. provider found Visit Date: 02/28/2022   Subjective:  Follow-up (Feeling pretty good, no complaints.)   History of Present Illness: Katie Sanders is a 31 y.o. 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 Since our last visit she is feeling well without any major flare ups. Mild swelling in finger joints, most often 2nd and 3rd fingers of both hands but none today. The chest pain she was experiencing resolved and no new episodes after April. She has no new infections or trouble taking medication.  Previous HPI 11/14/2021 Katie Sanders is a 31 y.o. female here for follow up for seropositive RA on Humira 40 mg [place] q14days and MTX 20 mg PO weekly. Overall doing okay but has some episodes of left sided upper chest pain. Pain does not radiate, not associated with shortness of breath or sweating or palpitations. This is not provoked with positional change or any specific physical activity. This came and went repeatedly but is not acting up today. Joint pain and swelling is coming and going worst in hands, feet, and knees also some elbow pains. This is not too limiting and she notices some symptom variation between Humira doses. Her fatigue is the worst symptom.   Previous HPI 08/16/21 Katie Sanders is a 31 y.o. female here for follow up for seropositive RA on Humira 40 mg [place] q14days and MTX 20 mg PO weekly and folic acid 2 mg daily. Labs at last visit showing mild leukopenia at 3.2 and vitamin D insufficiency at 21 recommending starting 1000 units daily supplementation. She has noticed some increased pain in bilateral knees most often worse with climbing stairs or getting up from the toilet or very low seats. She also has some right elbow pain but does not recall any injury or  particular motion affecting this. She is also more tired overall with low energy despite no change in sleep.   Previous HPI 05/17/21 Katie Sanders is a 31 y.o. 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 She is transferring care from [facility] rheumatology to here due to relocation.  Symptoms started with joint pain particularly involving her bilateral hands and knees since 5 or 6 years ago but she mostly ignored these until severity increased evaluation for this started treatment with rheumatology in about 2020 initially hydroxychloroquine was not effective or not well-tolerated he subsequently started methotrexate and then after incomplete response also started Humira.  Disease has been well controlled on this regimen although she did experience missing her Humira treatment for an extra approximately 2 weeks due to waiting on prior authorization for prescription refill causing some symptom exacerbation and she took a short steroid taper about a month ago due to this. She has experienced some hair thinning with the methotrexate treatment but no other major side effect problems since increasing to 2 mg of folic acid daily.  She also describes a problem of brain fog with concentration difficulties she feels like she has attention deficit problems that this is worsened during the past 2 years.  This did  not correlate in particular with a change to third shift work, although her sleep has been more disrupted with this schedule change. Previous hand x-rays did not demonstrate any erosive disease changes.  She does not recall having any prior chest x-ray.   Labs  09/2020 RF+ CCP+ ANA 1:320   08/2019 HBV neg HCV neg HIV neg   12/2020 CBC wnl CMP wnl     Review of Systems  Constitutional:  Negative for fatigue.  HENT:  Negative for mouth sores and mouth dryness.   Eyes:  Negative for dryness.  Respiratory:   Negative for shortness of breath.   Cardiovascular:  Negative for chest pain and palpitations.  Gastrointestinal:  Negative for blood in stool, constipation and diarrhea.  Endocrine: Negative for increased urination.  Genitourinary:  Negative for involuntary urination.  Musculoskeletal:  Positive for joint pain, joint pain and morning stiffness. Negative for joint swelling, myalgias, muscle weakness, muscle tenderness and myalgias.  Skin:  Negative for color change, rash, hair loss and sensitivity to sunlight.  Allergic/Immunologic: Positive for susceptible to infections.  Neurological:  Negative for dizziness and headaches.  Hematological:  Negative for swollen glands.  Psychiatric/Behavioral:  Negative for depressed mood and sleep disturbance. The patient is not nervous/anxious.     PMFS History:  Patient Active Problem List   Diagnosis Date Noted   Sleep disorder 02/18/2022   Other fatigue 02/18/2022   Anemia 02/18/2022   Encounter for female family planning counseling 02/18/2022   Patellofemoral pain syndrome of both knees 08/16/2021   Vitamin D deficiency 05/17/2021   Obesity (BMI 35.0-39.9 without comorbidity) 09/08/2018   Rheumatoid arthritis involving multiple sites with positive rheumatoid factor (HCC) 08/14/2018   High risk medication use 08/14/2018    Past Medical History:  Diagnosis Date   Rheumatoid arthritis (HCC)    Seasonal allergies     Family History  Problem Relation Age of Onset   Hypertension Mother    Thyroid disease Mother    Anemia Mother    Hypertension Father    Anemia Sister        oldest   Kidney failure Brother    Hypertension Maternal Grandmother    Diabetes Maternal Grandmother    Hypertension Maternal Grandfather    Hypertension Paternal Grandmother    Breast cancer Paternal Grandmother        unknown age of onset   Hypertension Paternal Grandfather    Past Surgical History:  Procedure Laterality Date   HERNIA REPAIR     Social  History   Social History Narrative   One 17 year old son    In a two year relationship with her s/o , female    Immunization History  Administered Date(s) Administered   Influenza,inj,Quad PF,6+ Mos 06/21/2014   PFIZER(Purple Top)SARS-COV-2 Vaccination 07/27/2019, 09/06/2019   Pneumococcal Conjugate-13 11/20/2018   Pneumococcal Polysaccharide-23 02/22/2019   Tdap 09/20/2014     Objective: Vital Signs: BP 115/73 (BP Location: Left Arm, Patient Position: Sitting, Cuff Size: Normal)   Pulse 65   Resp 14   Ht 5\' 2"  (1.575 m)   Wt 201 lb 3.2 oz (91.3 kg)   LMP 02/18/2022   BMI 36.80 kg/m    Physical Exam Constitutional:      Appearance: She is obese.  Cardiovascular:     Rate and Rhythm: Normal rate and regular rhythm.  Pulmonary:     Effort: Pulmonary effort is normal.     Breath sounds: Normal breath sounds.  Musculoskeletal:  Right lower leg: No edema.     Left lower leg: No edema.  Skin:    General: Skin is warm and dry.  Neurological:     Mental Status: She is alert.  Psychiatric:        Mood and Affect: Mood normal.      Musculoskeletal Exam:  Shoulders full ROM no tenderness or swelling Elbows full ROM no tenderness or swelling Wrists full ROM no tenderness or swelling Fingers full ROM no tenderness or swelling Knees full ROM no tenderness or swelling Ankles full ROM no tenderness or swelling   CDAI Exam: CDAI Score: 2  Patient Global: 10 mm; Provider Global: 10 mm Swollen: 0 ; Tender: 0  Joint Exam 02/28/2022   All documented joints were normal     Investigation: No additional findings.  Imaging: No results found.  Recent Labs: Lab Results  Component Value Date   WBC 5.3 02/18/2022   HGB 11.7 (L) 02/18/2022   PLT 286.0 02/18/2022   NA 139 11/14/2021   K 3.8 11/14/2021   CL 104 11/14/2021   CO2 26 11/14/2021   GLUCOSE 82 11/14/2021   BUN 14 11/14/2021   CREATININE 0.73 11/14/2021   BILITOT 0.4 11/14/2021   ALKPHOS 69 11/15/2012    AST 18 11/14/2021   ALT 29 11/14/2021   PROT 7.8 11/14/2021   ALBUMIN 3.8 11/15/2012   CALCIUM 9.2 11/14/2021   GFRAA >60 11/15/2012   QFTBGOLDPLUS NEGATIVE 05/17/2021    Speciality Comments: No specialty comments available.  Procedures:  No procedures performed Allergies: Patient has no known allergies.   Assessment / Plan:     Visit Diagnoses: Rheumatoid arthritis involving multiple sites with positive rheumatoid factor (HCC) - Plan: Sedimentation rate  Rheumatoid arthritis appears to be well controlled though she did have a couple areas of joint pain in hands coming and going.  The chest pain symptoms have stayed gone since April.  Checking sedimentation rate for disease activity monitoring.  Plan to continue methotrexate 20 mg p.o. weekly folic acid 1 mg daily and Humira 40 mg subcu q. 14 days.  High risk medication use - methotrexate 20 mg PO weekly and folic acid 1 mg daily and Humira 40 mg [place] q14days. - Plan: COMPLETE METABOLIC PANEL WITH GFR, QuantiFERON-TB Gold Plus, CBC with Differential/Platelet, COMPLETE METABOLIC PANEL WITH GFR  Appears to be tolerating medications well with no major infections over the interval.  We will check CBC and CMP also update QuantiFERON for methotrexate and Humira toxicity monitoring.  She is interested in trying to decrease the frequency of clinic appointments if doing well I discussed she can have lab only follow-up in 3 months is okay if no changes on treatment regimen and stable, future orders placed.   Orders: Orders Placed This Encounter  Procedures   Sedimentation rate   COMPLETE METABOLIC PANEL WITH GFR   QuantiFERON-TB Gold Plus   CBC with Differential/Platelet   COMPLETE METABOLIC PANEL WITH GFR   No orders of the defined types were placed in this encounter.    Follow-Up Instructions: Return in about 6 months (around 08/31/2022) for RA on ADA/MTX f/u 73mos.   Fuller Plan, MD  Note - This record has been created using  [employer].  Chart creation errors have been sought, but may not always  have been located. Such creation errors do not reflect on  the standard of medical care.

## 2022-02-18 NOTE — Assessment & Plan Note (Signed)
Ordering cbc ibc ferritin pending results 

## 2022-02-18 NOTE — Assessment & Plan Note (Signed)
Continue otc supplement vitamin d Ordering vitamin d today pending results

## 2022-02-18 NOTE — Assessment & Plan Note (Signed)
Work on diet and exercise Ordering tsh pending results

## 2022-02-19 ENCOUNTER — Telehealth: Payer: Self-pay

## 2022-02-19 ENCOUNTER — Other Ambulatory Visit: Payer: Self-pay | Admitting: Family

## 2022-02-19 DIAGNOSIS — E559 Vitamin D deficiency, unspecified: Secondary | ICD-10-CM

## 2022-02-19 DIAGNOSIS — Z419 Encounter for procedure for purposes other than remedying health state, unspecified: Secondary | ICD-10-CM | POA: Diagnosis not present

## 2022-02-19 LAB — LIPID PANEL
Cholesterol: 146 mg/dL (ref 0–200)
HDL: 41.9 mg/dL (ref >39.00)
LDL Cholesterol: 86 mg/dL (ref 0–99)
NonHDL: 104.09
Total CHOL/HDL Ratio: 3
Triglycerides: 91 mg/dL (ref 0.0–149.0)
VLDL: 18.2 mg/dL (ref 0.0–40.0)

## 2022-02-19 LAB — CBC
HCT: 35 % — ABNORMAL LOW (ref 36.0–46.0)
Hemoglobin: 11.7 g/dL — ABNORMAL LOW (ref 12.0–15.0)
MCHC: 33.5 g/dL (ref 30.0–36.0)
MCV: 92 fl (ref 78.0–100.0)
Platelets: 286 10*3/uL (ref 150.0–400.0)
RBC: 3.81 Mil/uL — ABNORMAL LOW (ref 3.87–5.11)
RDW: 15.5 % (ref 11.5–15.5)
WBC: 5.3 10*3/uL (ref 4.0–10.5)

## 2022-02-19 LAB — IBC + FERRITIN
Ferritin: 11.1 ng/mL (ref 10.0–291.0)
Iron: 105 ug/dL (ref 42–145)
Saturation Ratios: 20.8 % (ref 20.0–50.0)
TIBC: 505.4 ug/dL — ABNORMAL HIGH (ref 250.0–450.0)
Transferrin: 361 mg/dL — ABNORMAL HIGH (ref 212.0–360.0)

## 2022-02-19 LAB — TSH: TSH: 1.18 u[IU]/mL (ref 0.35–5.50)

## 2022-02-19 LAB — VITAMIN D 25 HYDROXY (VIT D DEFICIENCY, FRACTURES): VITD: 14.91 ng/mL — ABNORMAL LOW (ref 30.00–100.00)

## 2022-02-19 LAB — B12 AND FOLATE PANEL
Folate: >24.2 ng/mL (ref >5.9)
Vitamin B-12: 465 pg/mL (ref 211–911)

## 2022-02-19 MED ORDER — VITAMIN D (ERGOCALCIFEROL) 1.25 MG (50000 UNIT) PO CAPS: 50000.0000 [IU] | ORAL_CAPSULE | ORAL | 0 refills | Status: AC

## 2022-02-19 NOTE — Telephone Encounter (Signed)
Left message for pt to call office back regarding referral that was sent to [facility].  

## 2022-02-22 ENCOUNTER — Other Ambulatory Visit: Payer: Self-pay | Admitting: Internal Medicine

## 2022-02-22 ENCOUNTER — Telehealth: Payer: Self-pay | Admitting: Internal Medicine

## 2022-02-22 DIAGNOSIS — M0579 Rheumatoid arthritis with rheumatoid factor of multiple sites without organ or systems involvement: Secondary | ICD-10-CM

## 2022-02-22 MED ORDER — METHOTREXATE 2.5 MG PO TABS: 20.0000 mg | ORAL_TABLET | ORAL | 0 refills | Status: DC

## 2022-02-22 NOTE — Telephone Encounter (Signed)
Patient called requesting prescription refill of [facility] [address] [facility] [place].  Patient states she is out of medication and due to take it today, 02/22/22.

## 2022-02-22 NOTE — Telephone Encounter (Signed)
See rx request for details. Message forwarded to Dr. Dimple Casey to refill

## 2022-02-22 NOTE — Telephone Encounter (Signed)
Next Visit: 02/28/2022  Last Visit: 11/14/2021  Last Fill: 12/17/2021  DX: Rheumatoid arthritis involving multiple sites with positive rheumatoid factor   Current Dose per office note 11/14/2021: methotrexate 20 mg PO weekly   Labs: 02/18/2022 RBC 3.81, Hgb 11.7, Hct 35.0 11/14/2021 Lab results look okay for continuing medications  Okay to refill MTX?

## 2022-02-28 ENCOUNTER — Ambulatory Visit: Payer: Managed Care, Other (non HMO) | Attending: Internal Medicine | Admitting: Internal Medicine

## 2022-02-28 ENCOUNTER — Encounter: Payer: Self-pay | Admitting: Internal Medicine

## 2022-02-28 VITALS — BP 115/73 | HR 65 | Resp 14 | Ht 62.0 in | Wt 201.2 lb

## 2022-02-28 DIAGNOSIS — M222X2 Patellofemoral disorders, left knee: Secondary | ICD-10-CM

## 2022-02-28 DIAGNOSIS — Z79899 Other long term (current) drug therapy: Secondary | ICD-10-CM | POA: Diagnosis not present

## 2022-02-28 DIAGNOSIS — R5383 Other fatigue: Secondary | ICD-10-CM

## 2022-02-28 DIAGNOSIS — M222X1 Patellofemoral disorders, right knee: Secondary | ICD-10-CM

## 2022-02-28 DIAGNOSIS — M0579 Rheumatoid arthritis with rheumatoid factor of multiple sites without organ or systems involvement: Secondary | ICD-10-CM

## 2022-02-28 DIAGNOSIS — Z3009 Encounter for other general counseling and advice on contraception: Secondary | ICD-10-CM

## 2022-03-04 LAB — COMPLETE METABOLIC PANEL WITH GFR
AG Ratio: 1.4 (calc) (ref 1.0–2.5)
ALT: 17 U/L (ref 6–29)
AST: 13 U/L (ref 10–30)
Albumin: 4.1 g/dL (ref 3.6–5.1)
Alkaline phosphatase (APISO): 62 U/L (ref 31–125)
BUN: 9 mg/dL (ref 7–25)
CO2: 25 mmol/L (ref 20–32)
Calcium: 8.6 mg/dL (ref 8.6–10.2)
Chloride: 105 mmol/L (ref 98–110)
Creat: 0.75 mg/dL (ref 0.50–0.97)
Globulin: 2.9 g/dL (calc) (ref 1.9–3.7)
Glucose, Bld: 85 mg/dL (ref 65–99)
Potassium: 4.2 mmol/L (ref 3.5–5.3)
Sodium: 137 mmol/L (ref 135–146)
Total Bilirubin: 0.3 mg/dL (ref 0.2–1.2)
Total Protein: 7 g/dL (ref 6.1–8.1)
eGFR: 108 mL/min/{1.73_m2} (ref >=60)

## 2022-03-04 LAB — QUANTIFERON-TB GOLD PLUS
Mitogen-NIL: 7.81 IU/mL
NIL: 0.01 IU/mL
QuantiFERON-TB Gold Plus: NEGATIVE
TB1-NIL: 0 IU/mL
TB2-NIL: 0 IU/mL

## 2022-03-04 LAB — SEDIMENTATION RATE: Sed Rate: 22 mm/h — ABNORMAL HIGH (ref 0–20)

## 2022-03-05 NOTE — Progress Notes (Signed)
Lab results look fine for continuing the Humira and methotrexate.  Her sedimentation rate test remains very slightly elevated but looks like her RA is overall well controlled.

## 2022-03-22 DIAGNOSIS — Z419 Encounter for procedure for purposes other than remedying health state, unspecified: Secondary | ICD-10-CM | POA: Diagnosis not present

## 2022-04-09 ENCOUNTER — Ambulatory Visit (INDEPENDENT_AMBULATORY_CARE_PROVIDER_SITE_OTHER): Payer: Managed Care, Other (non HMO) | Admitting: Obstetrics and Gynecology

## 2022-04-09 ENCOUNTER — Other Ambulatory Visit (HOSPITAL_COMMUNITY)
Admission: RE | Admit: 2022-04-09 | Discharge: 2022-04-09 | Disposition: A | Discharge status: Home / Self Care | Payer: Managed Care, Other (non HMO) | Source: Ambulatory Visit | Attending: Family Medicine | Admitting: Family Medicine

## 2022-04-09 ENCOUNTER — Encounter: Payer: Self-pay | Admitting: Obstetrics and Gynecology

## 2022-04-09 VITALS — BP 132/77 | HR 73 | Ht 62.0 in | Wt 201.0 lb

## 2022-04-09 DIAGNOSIS — Z113 Encounter for screening for infections with a predominantly sexual mode of transmission: Secondary | ICD-10-CM

## 2022-04-09 DIAGNOSIS — N920 Excessive and frequent menstruation with regular cycle: Secondary | ICD-10-CM | POA: Diagnosis not present

## 2022-04-09 LAB — POCT URINE PREGNANCY: Preg Test, Ur: NEGATIVE

## 2022-04-09 MED ORDER — NORETHIN ACE-ETH ESTRAD-FE 1-20 MG-MCG(24) PO TABS: 1.0000 | ORAL_TABLET | Freq: Every day | ORAL | 11 refills | Status: AC

## 2022-04-09 NOTE — Progress Notes (Signed)
Subjective:     Katie Sanders is a 31 y.o. female P1 with LMP 03/19/22 and BMI 36 who is here to establish care. The patient reports a regular monthly period lasting 8-10 days. She was recently informed by her rheumatologist that she was anemic. Patient is sexually active without contraception. She denies pelvic pain or abnormal discharge. She  desires STI testing and is without any other complaints  Past Medical History:  Diagnosis Date   Rheumatoid arthritis ([place])    Seasonal allergies    Past Surgical History:  Procedure Laterality Date   HERNIA REPAIR     Family History  Problem Relation Age of Onset   Hypertension Mother    Thyroid disease Mother    Anemia Mother    Hypertension Father    Anemia Sister        oldest   Kidney failure Brother    Hypertension Maternal Grandmother    Diabetes Maternal Grandmother    Hypertension Maternal Grandfather    Hypertension Paternal Grandmother    Breast cancer Paternal Grandmother        unknown age of onset   Hypertension Paternal Grandfather     Social History   Socioeconomic History   Marital status: Single    Spouse name: Not on file   Number of children: 1   Years of education: Not on file   Highest education level: Not on file  Occupational History    Employer: OTHER   Occupation: Mining engineer with [employer]  Tobacco Use   Smoking status: Never   Smokeless tobacco: Never  Vaping Use   Vaping Use: Never used  Substance and Sexual Activity   Alcohol use: Yes    Comment: 2 monthly   Drug use: No   Sexual activity: Yes    Partners: Male    Birth control/protection: Coitus interruptus  Other Topics Concern   Not on file  Social History Narrative   One 19 year old son    In a two year relationship with her s/o , female    Social Determinants of Health   Financial Resource Strain: Not on file  Food Insecurity: Not on file  Transportation Needs: Not on file  Physical Activity: Not on file  Stress:  Not on file  Social Connections: Not on file  Intimate Partner Violence: Not on file   Health Maintenance  Topic Date Due   COVID-19 Vaccine (3 - Pfizer risk series) 10/04/2019   INFLUENZA VACCINE  02/19/2022   PAP SMEAR-Modifier  08/01/2024   TETANUS/TDAP  09/19/2024   Hepatitis C Screening  Completed   HIV Screening  Completed   HPV VACCINES  Aged Out       Review of Systems Pertinent items noted in HPI and remainder of comprehensive ROS otherwise negative.   Objective:  Blood pressure 132/77, pulse 73, height 5\' 2"  (1.575 m), weight 201 lb (91.2 kg), last menstrual period 03/19/2022.   GENERAL: Well-developed, well-nourished female in no acute distress.  HEENT: Normocephalic, atraumatic. Sclerae anicteric.  NECK: Supple. Normal thyroid.  LUNGS: Clear to auscultation bilaterally.  HEART: Regular rate and rhythm. BREASTS: Symmetric in size. No palpable masses or lymphadenopathy, skin changes, or nipple drainage. ABDOMEN: Soft, nontender, nondistended. No organomegaly. PELVIC: Normal external female genitalia. Vagina is pink and rugated.  Normal discharge. Normal appearing cervix. Uterus is normal in size. No adnexal mass or tenderness. Chaperone present during the pelvic exam EXTREMITIES: No cyanosis, clubbing, or edema, 2+ distal pulses.  Assessment:    Healthy female exam.      Plan:    Patient with normal pap smear 07/2021 Pelvic ultrasound ordered to rule out structural anomalies Discussed contraception options to regulate cycle- patient opted for COC- Rx Loestrin Fe provided STI screening per patient request Patient will be contacted with abnormal results See After Visit Summary for Counseling Recommendations

## 2022-04-09 NOTE — Progress Notes (Signed)
New GYN  C/O: Prolonged cycles x 3 months  Pt recently was told she was anemic by rheumatologist .  LMP: 03/19/22-03/28/22 starts off light first 3 days , then heavy then light again.  Family Hx of Breast Cancer: PGM  *Last pap: 08/01/2021 WNL   STD Screening: Desires Full Panel and screening for BV and Yeast.   UPT in office today is NEGATIVE.

## 2022-04-10 ENCOUNTER — Telehealth: Payer: Self-pay | Admitting: Pharmacist

## 2022-04-10 LAB — CERVICOVAGINAL ANCILLARY ONLY
Bacterial Vaginitis (gardnerella): NEGATIVE
Candida Glabrata: NEGATIVE
Candida Vaginitis: NEGATIVE
Chlamydia: NEGATIVE
Comment: NEGATIVE
Comment: NEGATIVE
Comment: NEGATIVE
Comment: NEGATIVE
Comment: NEGATIVE
Comment: NORMAL
Neisseria Gonorrhea: NEGATIVE
Trichomonas: NEGATIVE

## 2022-04-10 LAB — HEPATITIS B SURFACE ANTIGEN: Hepatitis B Surface Ag: NEGATIVE

## 2022-04-10 LAB — HIV ANTIBODY (ROUTINE TESTING W REFLEX): HIV Screen 4th Generation wRfx: NONREACTIVE

## 2022-04-10 LAB — HEPATITIS C ANTIBODY: Hep C Virus Ab: NONREACTIVE

## 2022-04-10 LAB — RPR: RPR Ser Ql: NONREACTIVE

## 2022-04-10 NOTE — Telephone Encounter (Signed)
Submitted a Prior Authorization RENEWAL request to [facility] for HUMIRA via CoverMyMeds. Will update once we receive a response.  Case ID # [id number] Phone: [phone] Fax: [phone]  Knox Saliva, PharmD, MPH, BCPS, CPP Clinical Pharmacist (Rheumatology and Pulmonology)

## 2022-04-12 NOTE — Telephone Encounter (Signed)
Received notification from [facility] regarding a prior authorization for [place]. Authorization has been APPROVED from 04/11/22 to 04/12/23.   Patient must continue to fill through [facility]: [phone]  Case ID # [id number]  Knox Saliva, PharmD, MPH, BCPS, CPP Clinical Pharmacist (Rheumatology and Pulmonology)

## 2022-04-21 DIAGNOSIS — Z419 Encounter for procedure for purposes other than remedying health state, unspecified: Secondary | ICD-10-CM | POA: Diagnosis not present

## 2022-05-15 ENCOUNTER — Encounter: Payer: Managed Care, Other (non HMO) | Admitting: Family Medicine

## 2022-05-22 ENCOUNTER — Other Ambulatory Visit: Payer: Self-pay | Admitting: Internal Medicine

## 2022-05-22 DIAGNOSIS — Z419 Encounter for procedure for purposes other than remedying health state, unspecified: Secondary | ICD-10-CM | POA: Diagnosis not present

## 2022-05-22 DIAGNOSIS — M0579 Rheumatoid arthritis with rheumatoid factor of multiple sites without organ or systems involvement: Secondary | ICD-10-CM

## 2022-05-22 NOTE — Telephone Encounter (Signed)
Next Visit: 09/03/2022  Last Visit: 02/28/2022  Last Fill: 11/20/2021  DX: Rheumatoid arthritis involving multiple sites with positive rheumatoid factor   Current Dose per office note 02/28/2022: Humira 40 mg subcu q. 14 days  Labs: 02/28/2022 Lab results look fine for continuing the Humira and methotrexate.  Her sedimentation rate test remains very slightly elevated but looks like her RA is overall well controlled.  TB Gold: 02/28/2022 Neg   Okay to refill Humira? And this scrip is for a 6 month supply. Fill for 6 or 3 months?

## 2022-05-23 ENCOUNTER — Telehealth: Payer: Self-pay | Admitting: *Deleted

## 2022-05-23 NOTE — Telephone Encounter (Signed)
[facility] contacted the office for the Humira Pen needing PA for secondary insurance. PA expired on 04/25/2022 and they need the new PA to have a start date on 05/07/2022  Key: [facility]

## 2022-05-23 NOTE — Telephone Encounter (Signed)
Received notification from [facility] regarding a prior authorization for [place]. Authorization has been APPROVED from 05/23/22 to 05/23/23.   Authorization # [id number]  Knox Saliva, PharmD, MPH, BCPS, CPP Clinical Pharmacist (Rheumatology and Pulmonology)

## 2022-05-23 NOTE — Telephone Encounter (Signed)
Submitted an EXPEDITED Prior Authorization request to [facility] for [place] via CoverMyMeds. Will update once we receive a response.  Key: [id number]  Knox Saliva, PharmD, MPH, BCPS, CPP Clinical Pharmacist (Rheumatology and Pulmonology)

## 2022-05-27 ENCOUNTER — Other Ambulatory Visit: Payer: Self-pay | Admitting: Internal Medicine

## 2022-05-27 ENCOUNTER — Telehealth: Payer: Self-pay | Admitting: Internal Medicine

## 2022-05-27 DIAGNOSIS — M0579 Rheumatoid arthritis with rheumatoid factor of multiple sites without organ or systems involvement: Secondary | ICD-10-CM

## 2022-05-27 NOTE — Telephone Encounter (Signed)
Patient called to check if she is due for labwork.  Patient requested a return call.

## 2022-05-27 NOTE — Telephone Encounter (Signed)
I called patient, labs due 05/31/2022

## 2022-05-27 NOTE — Telephone Encounter (Signed)
Next Visit: 09/03/2022  Last Visit: 02/28/2022  Last Fill: MTX 02/23/6313 and folic acid 9/70/2637  DX: Rheumatoid arthritis involving multiple sites with positive rheumatoid factor   Current Dose per office note 02/28/2022:   Labs: 02/28/2022 Lab results look fine for continuing the Humira and methotrexate.  Her sedimentation rate test remains very slightly elevated but looks like her RA is overall well controlled.   Okay to refill methotrexate and folic acid?

## 2022-06-06 ENCOUNTER — Other Ambulatory Visit: Payer: Self-pay | Admitting: *Deleted

## 2022-06-06 DIAGNOSIS — Z79899 Other long term (current) drug therapy: Secondary | ICD-10-CM

## 2022-06-07 LAB — COMPLETE METABOLIC PANEL WITH GFR
AG Ratio: 1.4 (calc) (ref 1.0–2.5)
ALT: 20 U/L (ref 6–29)
AST: 14 U/L (ref 10–30)
Albumin: 4 g/dL (ref 3.6–5.1)
Alkaline phosphatase (APISO): 68 U/L (ref 31–125)
BUN: 11 mg/dL (ref 7–25)
CO2: 28 mmol/L (ref 20–32)
Calcium: 8.9 mg/dL (ref 8.6–10.2)
Chloride: 106 mmol/L (ref 98–110)
Creat: 0.65 mg/dL (ref 0.50–0.97)
Globulin: 2.9 g/dL (calc) (ref 1.9–3.7)
Glucose, Bld: 81 mg/dL (ref 65–99)
Potassium: 4.1 mmol/L (ref 3.5–5.3)
Sodium: 140 mmol/L (ref 135–146)
Total Bilirubin: 0.3 mg/dL (ref 0.2–1.2)
Total Protein: 6.9 g/dL (ref 6.1–8.1)
eGFR: 120 mL/min/{1.73_m2} (ref >=60)

## 2022-06-07 LAB — CBC WITH DIFFERENTIAL/PLATELET
Absolute Monocytes: 402 cells/uL (ref 200–950)
Basophils Absolute: 20 cells/uL (ref 0–200)
Basophils Relative: 0.4 %
Eosinophils Absolute: 250 cells/uL (ref 15–500)
Eosinophils Relative: 5.1 %
HCT: 34.5 % — ABNORMAL LOW (ref 35.0–45.0)
Hemoglobin: 11.4 g/dL — ABNORMAL LOW (ref 11.7–15.5)
Lymphs Abs: 1357 cells/uL (ref 850–3900)
MCH: 30.2 pg (ref 27.0–33.0)
MCHC: 33 g/dL (ref 32.0–36.0)
MCV: 91.3 fL (ref 80.0–100.0)
MPV: 11.3 fL (ref 7.5–12.5)
Monocytes Relative: 8.2 %
Neutro Abs: 2871 cells/uL (ref 1500–7800)
Neutrophils Relative %: 58.6 %
Platelets: 284 10*3/uL (ref 140–400)
RBC: 3.78 10*6/uL — ABNORMAL LOW (ref 3.80–5.10)
RDW: 14.8 % (ref 11.0–15.0)
Total Lymphocyte: 27.7 %
WBC: 4.9 10*3/uL (ref 3.8–10.8)

## 2022-06-07 NOTE — Progress Notes (Signed)
Lab results look fine for continuing the methotrexate and Humira treatment.  Her hemoglobin is very slightly low at 11.4 but this is pretty stable with her previous values of 11.7 and 11.6 during the past 6 months.

## 2022-06-10 ENCOUNTER — Telehealth: Payer: Self-pay

## 2022-06-10 DIAGNOSIS — M0579 Rheumatoid arthritis with rheumatoid factor of multiple sites without organ or systems involvement: Secondary | ICD-10-CM

## 2022-06-10 NOTE — Telephone Encounter (Signed)
I called patient to advise of recent lab results and patient is asking for a small prednisone taper because she is going out of town beginning this Wednesday for 1.5 weeks. Patient states she would like to have the prednisone on hand since she will be out of town and will only take if experiencing a flare. Patient states she is having a slight flare right now but it is nothing alarming at this time. If you will send in a prednisone taper, [facility] [address] [facility] [place].  Please advise. Thanks!

## 2022-06-11 MED ORDER — PREDNISONE 5 MG PO TABS: ORAL_TABLET | ORAL | 0 refills | Status: DC

## 2022-06-11 NOTE — Telephone Encounter (Signed)
Patient advised Dr. Dimple Casey [facility] She can take this if symptoms start to flare up further.

## 2022-06-11 NOTE — Telephone Encounter (Signed)
I sent a [facility]one [facility]. She can take this if symptoms start to flare up further.

## 2022-06-21 DIAGNOSIS — Z419 Encounter for procedure for purposes other than remedying health state, unspecified: Secondary | ICD-10-CM | POA: Diagnosis not present

## 2022-07-04 ENCOUNTER — Telehealth: Payer: Self-pay | Admitting: Internal Medicine

## 2022-07-04 NOTE — Telephone Encounter (Addendum)
Patient advised per Dr. Dimple Casey, based on our last visit he doesn't see symptoms that would require a parking accomodation--we were talking about decreasing follow up frequency because symptoms were partially better. Dr. Dimple Casey thinks we would need to discuss it at a visit more specifically, and including an updated examination. Patient expressed understanding.

## 2022-07-04 NOTE — Telephone Encounter (Signed)
Based on our last visit I don't see symptoms that would require a parking accomodation--we were talking about decreasing follow up frequency because symptoms were partially better. I think we would need to discuss it at a visit more specifically, and including an updated examination.

## 2022-07-04 NOTE — Telephone Encounter (Signed)
Patient called asking if Dr. Dimple Casey would be willing to approve a handicap placard "due to her arthritis pain."  Patient requested a return call.

## 2022-07-22 DIAGNOSIS — Z419 Encounter for procedure for purposes other than remedying health state, unspecified: Secondary | ICD-10-CM | POA: Diagnosis not present

## 2022-07-29 ENCOUNTER — Ambulatory Visit: Payer: Managed Care, Other (non HMO) | Admitting: Internal Medicine

## 2022-08-11 NOTE — Progress Notes (Deleted)
Office Visit Note  Patient: Katie Sanders             Date of Birth: 1991-07-06           MRN: [id number]             PCP: Eugenia Pancoast, [place] Referring: Eugenia Pancoast, FNP Visit Date: 08/12/2022   Subjective:  No chief complaint on file.   History of Present Illness: Katie Sanders is a 32 y.o. female here for follow up ***   Previous HPI 02/28/22 Katie Sanders is a 32 y.o. female here for follow up for seropositive RA on Humira 40 mg [place] q14days and MTX 20 mg PO weekly. Since our last visit she is feeling well without any major flare ups. Mild swelling in finger joints, most often 2nd and 3rd fingers of both hands but none today. The chest pain she was experiencing resolved and no new episodes after April. She has no new infections or trouble taking medication.   Previous HPI 11/14/2021 Katie Sanders is a 32 y.o. female here for follow up for seropositive RA on Humira 40 mg [place] q14days and MTX 20 mg PO weekly. Overall doing okay but has some episodes of left sided upper chest pain. Pain does not radiate, not associated with shortness of breath or sweating or palpitations. This is not provoked with positional change or any specific physical activity. This came and went repeatedly but is not acting up today. Joint pain and swelling is coming and going worst in hands, feet, and knees also some elbow pains. This is not too limiting and she notices some symptom variation between Humira doses. Her fatigue is the worst symptom.   Previous HPI 08/16/21 Katie Sanders is a 32 y.o. female here for follow up for seropositive RA on Humira 40 mg [place] q14days and MTX 20 mg PO weekly and folic acid 2 mg daily. Labs at last visit showing mild leukopenia at 3.2 and vitamin D insufficiency at 21 recommending starting 1000 units daily supplementation. She has noticed some increased pain in bilateral knees most often worse with climbing stairs or getting up from the toilet or very low seats. She also has  some right elbow pain but does not recall any injury or particular motion affecting this. She is also more tired overall with low energy despite no change in sleep.   Previous HPI 05/17/21 Katie Sanders is a 32 y.o. 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 She is transferring care from [facility] rheumatology to here due to relocation.  Symptoms started with joint pain particularly involving her bilateral hands and knees since 5 or 6 years ago but she mostly ignored these until severity increased evaluation for this started treatment with rheumatology in about 2020 initially hydroxychloroquine was not effective or not well-tolerated he subsequently started methotrexate and then after incomplete response also started Humira.  Disease has been well controlled on this regimen although she did experience missing her Humira treatment for an extra approximately 2 weeks due to waiting on prior authorization for prescription refill causing some symptom exacerbation and she took a short steroid taper about a month ago due to this. She has experienced some hair thinning with the methotrexate treatment but no other major side effect problems since increasing to 2 mg of folic acid daily.  She also describes a problem of brain fog with concentration difficulties she feels like she  has attention deficit problems that this is worsened during the past 2 years.  This did not correlate in particular with a change to third shift work, although her sleep has been more disrupted with this schedule change. Previous hand x-rays did not demonstrate any erosive disease changes.  She does not recall having any prior chest x-ray.   Labs  09/2020 RF+ CCP+ ANA 1:320   08/2019 HBV neg HCV neg HIV neg   12/2020 CBC wnl CMP wnl   No Rheumatology ROS completed.   PMFS History:  Patient Active Problem List   Diagnosis Date Noted    Sleep disorder 02/18/2022   Other fatigue 02/18/2022   Anemia 02/18/2022   Encounter for female family planning counseling 02/18/2022   Patellofemoral pain syndrome of both knees 08/16/2021   Vitamin D deficiency 05/17/2021   Obesity (BMI 35.0-39.9 without comorbidity) 09/08/2018   Rheumatoid arthritis involving multiple sites with positive rheumatoid factor ([place]) 08/14/2018   High risk medication use 08/14/2018    Past Medical History:  Diagnosis Date   Rheumatoid arthritis ([place])    Seasonal allergies     Family History  Problem Relation Age of Onset   Hypertension Mother    Thyroid disease Mother    Anemia Mother    Hypertension Father    Anemia Sister        oldest   Kidney failure Brother    Hypertension Maternal Grandmother    Diabetes Maternal Grandmother    Hypertension Maternal Grandfather    Hypertension Paternal Grandmother    Breast cancer Paternal Grandmother        unknown age of onset   Hypertension Paternal Grandfather    Past Surgical History:  Procedure Laterality Date   HERNIA REPAIR     Social History   Social History Narrative   One 14 year old son    In a two year relationship with her s/o , female    Immunization History  Administered Date(s) Administered   Influenza,inj,Quad PF,6+ Mos 06/21/2014   PFIZER(Purple Top)SARS-COV-2 Vaccination 07/27/2019, 09/06/2019   Pneumococcal Conjugate-13 11/20/2018   Pneumococcal Polysaccharide-23 02/22/2019   Tdap 09/20/2014     Objective: Vital Signs: There were no vitals taken for this visit.   Physical Exam   Musculoskeletal Exam: ***  CDAI Exam: CDAI Score: -- Patient Global: --; Provider Global: -- Swollen: --; Tender: -- Joint Exam 08/12/2022   No joint exam has been documented for this visit   There is currently no information documented on the homunculus. Go to the Rheumatology activity and complete the homunculus joint exam.  Investigation: No additional findings.  Imaging: No  results found.  Recent Labs: Lab Results  Component Value Date   WBC 4.9 06/06/2022   HGB 11.4 (L) 06/06/2022   PLT 284 06/06/2022   NA 140 06/06/2022   K 4.1 06/06/2022   CL 106 06/06/2022   CO2 28 06/06/2022   GLUCOSE 81 06/06/2022   BUN 11 06/06/2022   CREATININE 0.65 06/06/2022   BILITOT 0.3 06/06/2022   ALKPHOS 69 11/15/2012   AST 14 06/06/2022   ALT 20 06/06/2022   PROT 6.9 06/06/2022   ALBUMIN 3.8 11/15/2012   CALCIUM 8.9 06/06/2022   GFRAA >60 11/15/2012   QFTBGOLDPLUS NEGATIVE 02/28/2022    Speciality Comments: No specialty comments available.  Procedures:  No procedures performed Allergies: Patient has no known allergies.   Assessment / Plan:     Visit Diagnoses: No diagnosis found.  ***  Orders: No orders  of the defined types were placed in this encounter.  No orders of the defined types were placed in this encounter.    Follow-Up Instructions: No follow-ups on file.   Collier Salina, MD  Note - This record has been created using [employer].  Chart creation errors have been sought, but may not always  have been located. Such creation errors do not reflect on  the standard of medical care.

## 2022-08-12 ENCOUNTER — Encounter: Payer: Self-pay | Admitting: Internal Medicine

## 2022-08-12 ENCOUNTER — Ambulatory Visit: Payer: Managed Care, Other (non HMO) | Admitting: Internal Medicine

## 2022-08-12 ENCOUNTER — Ambulatory Visit: Payer: Managed Care, Other (non HMO) | Attending: Internal Medicine | Admitting: Internal Medicine

## 2022-08-12 VITALS — BP 128/77 | HR 65 | Resp 15 | Ht 62.0 in | Wt 198.8 lb

## 2022-08-12 DIAGNOSIS — Z79899 Other long term (current) drug therapy: Secondary | ICD-10-CM | POA: Diagnosis not present

## 2022-08-12 DIAGNOSIS — M222X2 Patellofemoral disorders, left knee: Secondary | ICD-10-CM

## 2022-08-12 DIAGNOSIS — M0579 Rheumatoid arthritis with rheumatoid factor of multiple sites without organ or systems involvement: Secondary | ICD-10-CM

## 2022-08-12 DIAGNOSIS — M222X1 Patellofemoral disorders, right knee: Secondary | ICD-10-CM

## 2022-08-12 NOTE — Progress Notes (Signed)
Office Visit Note  Patient: Katie Sanders             Date of Birth: 1990-08-20           MRN: [id number]             PCP: Eugenia Pancoast, FNP Referring: Eugenia Pancoast, FNP Visit Date: 08/12/2022   Subjective:   History of Present Illness: Katie Sanders is a 32 y.o. female here for follow up for seropositive RA on Humira and methotrexate 20 mg PO weekly and folic acid 1 mg daily. Symptoms are doing well overall but notices increased pain and stiffness for about 3 or 4 days before each Humira dose is due. Problems mostly in MCP joints of her hands and in the left knee. Knee swelling is usually on the lateral side of the joint. On the rest of days rates this just 2-3/10 in severity.    Previous HPI 02/28/22 Katie Sanders is a 32 y.o. female here for follow up for seropositive RA on Humira 40 mg [place] q14days and MTX 20 mg PO weekly. Since our last visit she is feeling well without any major flare ups. Mild swelling in finger joints, most often 2nd and 3rd fingers of both hands but none today. The chest pain she was experiencing resolved and no new episodes after April. She has no new infections or trouble taking medication.   Previous HPI 11/14/2021 Katie Sanders is a 32 y.o. female here for follow up for seropositive RA on Humira 40 mg [place] q14days and MTX 20 mg PO weekly. Overall doing okay but has some episodes of left sided upper chest pain. Pain does not radiate, not associated with shortness of breath or sweating or palpitations. This is not provoked with positional change or any specific physical activity. This came and went repeatedly but is not acting up today. Joint pain and swelling is coming and going worst in hands, feet, and knees also some elbow pains. This is not too limiting and she notices some symptom variation between Humira doses. Her fatigue is the worst symptom.   Previous HPI 08/16/21 Katie Sanders is a 32 y.o. female here for follow up for seropositive RA on  Humira 40 mg [place] q14days and MTX 20 mg PO weekly and folic acid 2 mg daily. Labs at last visit showing mild leukopenia at 3.2 and vitamin D insufficiency at 21 recommending starting 1000 units daily supplementation. She has noticed some increased pain in bilateral knees most often worse with climbing stairs or getting up from the toilet or very low seats. She also has some right elbow pain but does not recall any injury or particular motion affecting this. She is also more tired overall with low energy despite no change in sleep.   Previous HPI 05/17/21 Katie Sanders is a 32 y.o. 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 She is transferring care from [facility] rheumatology to here due to relocation.  Symptoms started with joint pain particularly involving her bilateral hands and knees since 5 or 6 years ago but she mostly ignored these until severity increased evaluation for this started treatment with rheumatology in about 2020 initially hydroxychloroquine was not effective or not well-tolerated he subsequently started methotrexate and then after incomplete response also started Humira.  Disease has been well controlled on this regimen although she did experience missing her Humira treatment for an extra approximately 2 weeks  due to waiting on prior authorization for prescription refill causing some symptom exacerbation and she took a short steroid taper about a month ago due to this. She has experienced some hair thinning with the methotrexate treatment but no other major side effect problems since increasing to 2 mg of folic acid daily.  She also describes a problem of brain fog with concentration difficulties she feels like she has attention deficit problems that this is worsened during the past 2 years.  This did not correlate in particular with a change to third shift work, although her sleep has been more  disrupted with this schedule change. Previous hand x-rays did not demonstrate any erosive disease changes.  She does not recall having any prior chest x-ray.   Labs  09/2020 RF+ CCP+ ANA 1:320   08/2019 HBV neg HCV neg HIV neg   12/2020 CBC wnl CMP wnl   Review of Systems  Constitutional:  Positive for fatigue.  HENT:  Negative for mouth sores and mouth dryness.   Eyes:  Negative for dryness.  Respiratory:  Negative for shortness of breath.   Cardiovascular:  Positive for chest pain. Negative for palpitations.  Gastrointestinal:  Negative for blood in stool, constipation and diarrhea.  Endocrine: Negative for increased urination.  Genitourinary:  Negative for involuntary urination.  Musculoskeletal:  Positive for joint pain, joint pain, joint swelling and morning stiffness. Negative for gait problem, myalgias, muscle weakness, muscle tenderness and myalgias.  Skin:  Negative for color change, rash, hair loss and sensitivity to sunlight.  Allergic/Immunologic: Negative for susceptible to infections.  Neurological:  Positive for headaches. Negative for dizziness.  Hematological:  Negative for swollen glands.  Psychiatric/Behavioral:  Negative for depressed mood and sleep disturbance. The patient is not nervous/anxious.     PMFS History:  Patient Active Problem List   Diagnosis Date Noted   Sleep disorder 02/18/2022   Other fatigue 02/18/2022   Anemia 02/18/2022   Encounter for female family planning counseling 02/18/2022   Patellofemoral pain syndrome of both knees 08/16/2021   Vitamin D deficiency 05/17/2021   Obesity (BMI 35.0-39.9 without comorbidity) 09/08/2018   Rheumatoid arthritis involving multiple sites with positive rheumatoid factor ([place]) 08/14/2018   High risk medication use 08/14/2018    Past Medical History:  Diagnosis Date   Rheumatoid arthritis ([place])    Seasonal allergies     Family History  Problem Relation Age of Onset   Hypertension Mother     Thyroid disease Mother    Anemia Mother    Hypertension Father    Anemia Sister        oldest   Kidney failure Brother    Hypertension Maternal Grandmother    Diabetes Maternal Grandmother    Hypertension Maternal Grandfather    Hypertension Paternal Grandmother    Breast cancer Paternal Grandmother        unknown age of onset   Hypertension Paternal Grandfather    Past Surgical History:  Procedure Laterality Date   HERNIA REPAIR     Social History   Social History Narrative   One 2 year old son    In a two year relationship with her s/o , female    Immunization History  Administered Date(s) Administered   Influenza,inj,Quad PF,6+ Mos 06/21/2014   PFIZER(Purple Top)SARS-COV-2 Vaccination 07/27/2019, 09/06/2019   Pneumococcal Conjugate-13 11/20/2018   Pneumococcal Polysaccharide-23 02/22/2019   Tdap 09/20/2014     Objective: Vital Signs: BP 128/77 (BP Location: Left Arm, Patient Position: Sitting, Cuff Size: Normal)  Pulse 65   Resp 15   Ht 5\' 2"  (1.575 m)   Wt 198 lb 12.8 oz (90.2 kg)   BMI 36.36 kg/m    Physical Exam Constitutional:      Appearance: She is obese.  Eyes:     Conjunctiva/sclera: Conjunctivae normal.  Cardiovascular:     Rate and Rhythm: Normal rate and regular rhythm.  Pulmonary:     Effort: Pulmonary effort is normal.     Breath sounds: Normal breath sounds.  Musculoskeletal:     Right lower leg: No edema.     Left lower leg: No edema.  Lymphadenopathy:     Cervical: Cervical adenopathy present.  Skin:    General: Skin is warm and dry.  Neurological:     Mental Status: She is alert.  Psychiatric:        Mood and Affect: Mood normal.      Musculoskeletal Exam:  Neck full ROM no tenderness Shoulders full ROM no tenderness or swelling Elbows full ROM no tenderness or swelling Wrists full ROM no tenderness or swelling Fingers full ROM no tenderness or swelling Knees full ROM no tenderness or swelling, lateral joint line mild  tenderness on left knee, right knee crepitus present Ankles full ROM no tenderness or swelling MTPs full ROM no tenderness or swelling   CDAI Exam: CDAI Score: 4  Patient Global: 20 mm; Provider Global: 20 mm Swollen: 0 ; Tender: 0  Joint Exam 08/12/2022   All documented joints were normal     Investigation: No additional findings.  Imaging: No results found.  Recent Labs: Lab Results  Component Value Date   WBC 4.4 08/12/2022   HGB 11.8 08/12/2022   PLT 285 08/12/2022   NA 138 08/12/2022   K 4.1 08/12/2022   CL 103 08/12/2022   CO2 27 08/12/2022   GLUCOSE 82 08/12/2022   BUN 7 08/12/2022   CREATININE 0.70 08/12/2022   BILITOT 0.4 08/12/2022   ALKPHOS 69 11/15/2012   AST 15 08/12/2022   ALT 20 08/12/2022   PROT 7.9 08/12/2022   ALBUMIN 3.8 11/15/2012   CALCIUM 9.4 08/12/2022   GFRAA >60 11/15/2012   QFTBGOLDPLUS NEGATIVE 02/28/2022    Speciality Comments: No specialty comments available.  Procedures:  No procedures performed Allergies: Patient has no known allergies.   Assessment / Plan:     Visit Diagnoses: Rheumatoid arthritis involving multiple sites with positive rheumatoid factor (HCC) - Plan: Sedimentation rate, C-reactive protein  Clinically symptoms appear to be well-controlled does have some knee pain particularly breaking through for about 3 to 4 days between doses. Although could be more from some osteoarthritis or possible patellofemoral pain syndrome but there is some intermittent left knee swelling will recheck sedimentation rate and CRP for disease activity monitoring.  Plan to continue Humira 40 mg subcu q. 14 days and methotrexate 20 mg p.o. weekly with folic acid 1 mg daily.  Patellofemoral pain syndrome of both knees  Discussed anterior knee pain may be consistent with some patellofemoral pain based on the provocative situations and anterior location of pain.  I do not feel any particular abnormal tracking or instability.  High risk  medication use - Plan: CBC with Differential/Platelet, COMPLETE METABOLIC PANEL WITH GFR  Checking CBC and CMP for medication monitoring on continued methotrexate and Humira.  Last QuantiFERON screening test from August was negative.  She denies any significant interval infections.     Orders: Orders Placed This Encounter  Procedures   Sedimentation rate  C-reactive protein   CBC with Differential/Platelet   COMPLETE METABOLIC PANEL WITH GFR   No orders of the defined types were placed in this encounter.    Follow-Up Instructions: Return in about 3 months (around 11/11/2022) for RA on ADA/MTX f/u 31mos.   Collier Salina, MD  Note - This record has been created using [employer].  Chart creation errors have been sought, but may not always  have been located. Such creation errors do not reflect on  the standard of medical care.

## 2022-08-13 ENCOUNTER — Ambulatory Visit: Payer: Managed Care, Other (non HMO) | Admitting: Internal Medicine

## 2022-08-13 ENCOUNTER — Other Ambulatory Visit: Payer: Self-pay | Admitting: *Deleted

## 2022-08-13 DIAGNOSIS — M0579 Rheumatoid arthritis with rheumatoid factor of multiple sites without organ or systems involvement: Secondary | ICD-10-CM

## 2022-08-13 LAB — CBC WITH DIFFERENTIAL/PLATELET
Absolute Monocytes: 242 cells/uL (ref 200–950)
Basophils Absolute: 22 cells/uL (ref 0–200)
Basophils Relative: 0.5 %
Eosinophils Absolute: 110 cells/uL (ref 15–500)
Eosinophils Relative: 2.5 %
HCT: 35.3 % (ref 35.0–45.0)
Hemoglobin: 11.8 g/dL (ref 11.7–15.5)
Lymphs Abs: 1826 cells/uL (ref 850–3900)
MCH: 30.3 pg (ref 27.0–33.0)
MCHC: 33.4 g/dL (ref 32.0–36.0)
MCV: 90.7 fL (ref 80.0–100.0)
MPV: 11.3 fL (ref 7.5–12.5)
Monocytes Relative: 5.5 %
Neutro Abs: 2200 cells/uL (ref 1500–7800)
Neutrophils Relative %: 50 %
Platelets: 285 10*3/uL (ref 140–400)
RBC: 3.89 10*6/uL (ref 3.80–5.10)
RDW: 14.7 % (ref 11.0–15.0)
Total Lymphocyte: 41.5 %
WBC: 4.4 10*3/uL (ref 3.8–10.8)

## 2022-08-13 LAB — COMPLETE METABOLIC PANEL WITH GFR
AG Ratio: 1.4 (calc) (ref 1.0–2.5)
ALT: 20 U/L (ref 6–29)
AST: 15 U/L (ref 10–30)
Albumin: 4.6 g/dL (ref 3.6–5.1)
Alkaline phosphatase (APISO): 63 U/L (ref 31–125)
BUN: 7 mg/dL (ref 7–25)
CO2: 27 mmol/L (ref 20–32)
Calcium: 9.4 mg/dL (ref 8.6–10.2)
Chloride: 103 mmol/L (ref 98–110)
Creat: 0.7 mg/dL (ref 0.50–0.97)
Globulin: 3.3 g/dL (calc) (ref 1.9–3.7)
Glucose, Bld: 82 mg/dL (ref 65–99)
Potassium: 4.1 mmol/L (ref 3.5–5.3)
Sodium: 138 mmol/L (ref 135–146)
Total Bilirubin: 0.4 mg/dL (ref 0.2–1.2)
Total Protein: 7.9 g/dL (ref 6.1–8.1)
eGFR: 117 mL/min/{1.73_m2} (ref >=60)

## 2022-08-13 LAB — SEDIMENTATION RATE: Sed Rate: 19 mm/h (ref 0–20)

## 2022-08-13 LAB — C-REACTIVE PROTEIN: CRP: <0.2 mg/L (ref <8.0)

## 2022-08-13 MED ORDER — HUMIRA (2 PEN) 40 MG/0.4ML ~~LOC~~ AJKT: 40.0000 mg | AUTO-INJECTOR | SUBCUTANEOUS | 5 refills | Status: DC

## 2022-08-13 NOTE — Telephone Encounter (Signed)
Refill request received via fax from [facility] for Humira  Next Visit: 11/13/2022  Last Visit: 08/12/2022  Last Fill: 05/22/2022  BC:[id number] arthritis involving multiple sites with positive rheumatoid factor   Current Dose per office note 08/12/2022: not mentioned   Labs: 08/12/2021 Sedimentation rate has improved and now normal at 19. This indicates improvement in her inflammation. Labs look fine for continuing the Humira and methotrexate.   TB Gold: 02/28/2022 Neg    Okay to refill Humira?

## 2022-08-13 NOTE — Progress Notes (Signed)
Sedimentation rate has improved and now normal at 19. This indicates improvement in her inflammation. Labs look fine for continuing the Humira and methotrexate.

## 2022-08-16 ENCOUNTER — Other Ambulatory Visit: Payer: Self-pay | Admitting: Internal Medicine

## 2022-08-16 DIAGNOSIS — M0579 Rheumatoid arthritis with rheumatoid factor of multiple sites without organ or systems involvement: Secondary | ICD-10-CM

## 2022-08-16 MED ORDER — METHOTREXATE SODIUM 2.5 MG PO TABS: 20.0000 mg | ORAL_TABLET | ORAL | 0 refills | Status: DC

## 2022-08-16 NOTE — Telephone Encounter (Signed)
Next Visit: 11/13/2022  Last Visit: 08/12/2022  Last Fill: 05/27/2022  DX: Rheumatoid arthritis involving multiple sites with positive rheumatoid factor  Current Dose per office note on 08/12/2022: methotrexate 20 mg PO weekly   Labs: 08/12/2022 Sedimentation rate has improved and now normal at 19. This indicates improvement in her inflammation. Labs look fine for continuing the Humira and methotrexate.   Okay to refill MTX?

## 2022-08-22 DIAGNOSIS — Z419 Encounter for procedure for purposes other than remedying health state, unspecified: Secondary | ICD-10-CM | POA: Diagnosis not present

## 2022-08-26 ENCOUNTER — Ambulatory Visit: Payer: Managed Care, Other (non HMO) | Admitting: Internal Medicine

## 2022-09-03 ENCOUNTER — Ambulatory Visit: Payer: Managed Care, Other (non HMO) | Admitting: Internal Medicine

## 2022-09-12 ENCOUNTER — Telehealth: Payer: Self-pay | Admitting: Pharmacist

## 2022-09-12 NOTE — Telephone Encounter (Signed)
Submitted a Prior Authorization request to  [employer]  for [place] via CoverMyMeds. Will update once we receive a response.  Key: [id number]  Per automated response: An active authorization already exists for this patient. Proceed with prescribing the Medication or call Pharmacy Customer Care at [phone]. MPA: [id number] Effective: 07/22/2022 12:00:00 AM Terminate: 07/22/2023 12:00:00 AM  Faxed [facility] regarding this determination  Knox Saliva, PharmD, MPH, BCPS, CPP Clinical Pharmacist (Rheumatology and Pulmonology)

## 2022-09-13 ENCOUNTER — Telehealth: Payer: Self-pay | Admitting: Internal Medicine

## 2022-09-13 NOTE — Telephone Encounter (Unsigned)
Patient left a voicemail checking the status of her PA for her Humira medication.  Patient requested a return call.

## 2022-09-16 ENCOUNTER — Telehealth: Payer: Self-pay

## 2022-09-16 DIAGNOSIS — M0579 Rheumatoid arthritis with rheumatoid factor of multiple sites without organ or systems involvement: Secondary | ICD-10-CM

## 2022-09-16 NOTE — Telephone Encounter (Signed)
Patient called and asked about her prior authorization for her Humira. Patient stated that [place] did not receive her prior authorization yet. Patient advised that she has a prior authorization on file good from 07/22/22-07/22/23. Patient advised she should have a Humira prescription on file with [facility]. Patient advised our Pharmacist faxed the prior authorization to [place] and gave them the new prior authorization.

## 2022-09-18 ENCOUNTER — Other Ambulatory Visit (HOSPITAL_COMMUNITY): Payer: Self-pay

## 2022-09-18 MED ORDER — HUMIRA (2 PEN) 40 MG/0.4ML ~~LOC~~ AJKT: 40.0000 mg | AUTO-INJECTOR | SUBCUTANEOUS | 2 refills | Status: DC

## 2022-09-18 NOTE — Telephone Encounter (Signed)
PA is already on file. An active authorization already exists for this patient. Proceed with prescribing the Medication or call Pharmacy Customer Care at [phone] MPA: [phone] Effective: 07/22/2022  -   Terminate: 07/22/2023  Conference called insurance with pharmacy insurance department rep. Per insurance rep, patient must fill through [facility]. Phone: [phone]   Rx has been sent to pharmacy today. I called patient. She states that rx cannot be filled at [facility] because she has Medicaid as secondary. She talked to [place] a couple weeks ago  After 45 minutes of back and forth - override has been placed for [facility] to fill patient's Humira. This OVERRIDE has to be RENEWED every year.  Per rep, claim processed through successfully but [facility] is now requiring a new prescription. Rx sent. Spoke with patient again and advised that she can call [facility] later today to reschedule shipment.  Knox Saliva, PharmD, MPH, BCPS, CPP Clinical Pharmacist (Rheumatology and Pulmonology)

## 2022-09-18 NOTE — Addendum Note (Signed)
Addended by: Cassandria Anger on: 09/18/2022 10:13 AM   Modules accepted: Orders

## 2022-09-18 NOTE — Telephone Encounter (Signed)
This has been addressed on other telephone encounter.  Knox Saliva, PharmD, MPH, BCPS, CPP Clinical Pharmacist (Rheumatology and Pulmonology)

## 2022-09-20 DIAGNOSIS — Z419 Encounter for procedure for purposes other than remedying health state, unspecified: Secondary | ICD-10-CM | POA: Diagnosis not present

## 2022-10-21 ENCOUNTER — Other Ambulatory Visit: Payer: Self-pay

## 2022-10-21 DIAGNOSIS — Z419 Encounter for procedure for purposes other than remedying health state, unspecified: Secondary | ICD-10-CM | POA: Diagnosis not present

## 2022-10-22 ENCOUNTER — Telehealth: Payer: Self-pay

## 2022-10-22 NOTE — Telephone Encounter (Signed)
Received a fax replacement request for the patient for Humira Pen 40MG /0.4ML (1 Pen). [place] at [phone] to give a verbal authorization. A Pharmacist named Joe answered the phone. Gave the verbal authorization to replace one Humira pen.

## 2022-11-13 ENCOUNTER — Ambulatory Visit: Payer: Managed Care, Other (non HMO) | Attending: Internal Medicine | Admitting: Internal Medicine

## 2022-11-13 ENCOUNTER — Encounter: Payer: Self-pay | Admitting: Internal Medicine

## 2022-11-13 VITALS — BP 115/75 | HR 60 | Resp 15 | Ht 62.0 in | Wt 196.6 lb

## 2022-11-13 DIAGNOSIS — Z79899 Other long term (current) drug therapy: Secondary | ICD-10-CM

## 2022-11-13 DIAGNOSIS — M0579 Rheumatoid arthritis with rheumatoid factor of multiple sites without organ or systems involvement: Secondary | ICD-10-CM | POA: Diagnosis not present

## 2022-11-13 MED ORDER — HUMIRA (2 PEN) 40 MG/0.4ML ~~LOC~~ AJKT: 40.0000 mg | AUTO-INJECTOR | SUBCUTANEOUS | 5 refills | Status: DC

## 2022-11-13 MED ORDER — METHOTREXATE SODIUM 2.5 MG PO TABS: 20.0000 mg | ORAL_TABLET | ORAL | 0 refills | Status: DC

## 2022-11-13 NOTE — Progress Notes (Signed)
Office Visit Note  Patient: Katie Sanders             Date of Birth: 01/28/1991           MRN: [id number]             PCP: Mort Sawyers, FNP Referring: Mort Sawyers, FNP Visit Date: 11/13/2022   Subjective:  No chief complaint on file.   History of Present Illness: Katie Sanders is a 32 y.o. 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 14 days methotrexate 20 mg p.o. weekly folic acid 1 mg daily.  Overall symptoms are doing well still has a little bit of increased pain stiffness and movement trouble for up to a couple days before each Humira dose.  She has added additional walking exercise aiming for 10,000 steps or greater daily during days with low symptom severity.  Does occasionally miss work on the few days where symptoms flareup more partially from pain and swelling but also from increased fatigue.  Previous HPI 08/12/22 Katie Sanders is a 32 y.o. female here for follow up for seropositive RA on Humira and methotrexate 20 mg PO weekly and folic acid 1 mg daily. Symptoms are doing well overall but notices increased pain and stiffness for about 3 or 4 days before each Humira dose is due. Problems mostly in MCP joints of her hands and in the left knee. Knee swelling is usually on the lateral side of the joint. On the rest of days rates this just 2-3/10 in severity.      Previous HPI 02/28/22 Katie Sanders is a 32 y.o. female here for follow up for seropositive RA on Humira 40 mg [place] q14days and MTX 20 mg PO weekly. Since our last visit she is feeling well without any major flare ups. Mild swelling in finger joints, most often 2nd and 3rd fingers of both hands but none today. The chest pain she was experiencing resolved and no new episodes after April. She has no new infections or trouble taking medication.   Previous HPI 11/14/2021 Katie Sanders is a 32 y.o. female here for follow up for seropositive RA on Humira 40 mg [place] q14days and MTX 20 mg PO  weekly. Overall doing okay but has some episodes of left sided upper chest pain. Pain does not radiate, not associated with shortness of breath or sweating or palpitations. This is not provoked with positional change or any specific physical activity. This came and went repeatedly but is not acting up today. Joint pain and swelling is coming and going worst in hands, feet, and knees also some elbow pains. This is not too limiting and she notices some symptom variation between Humira doses. Her fatigue is the worst symptom.   Previous HPI 08/16/21 Katie Sanders is a 32 y.o. female here for follow up for seropositive RA on Humira 40 mg [place] q14days and MTX 20 mg PO weekly and folic acid 2 mg daily. Labs at last visit showing mild leukopenia at 3.2 and vitamin D insufficiency at 21 recommending starting 1000 units daily supplementation. She has noticed some increased pain in bilateral knees most often worse with climbing stairs or getting up from the toilet or very low seats. She also has some right elbow pain but does not recall any injury or particular motion affecting this. She is also more tired overall with low energy despite no change in sleep.   Previous HPI 05/17/21 Katie Sanders is a 32 y.o. female  here to establish care for seropositive RA on treatment with Humira and methotexate 20 mg PO weekly with folic acid 2 mg daily. She is transferring care from [facility] rheumatology to here due to relocation.  Symptoms started with joint pain particularly involving her bilateral hands and knees since 5 or 6 years ago but she mostly ignored these until severity increased evaluation for this started treatment with rheumatology in about 2020 initially hydroxychloroquine was not effective or not well-tolerated he subsequently started methotrexate and then after incomplete response also started Humira.  Disease has been well controlled on this regimen although she did experience missing her Humira treatment for an  extra approximately 2 weeks due to waiting on prior authorization for prescription refill causing some symptom exacerbation and she took a short steroid taper about a month ago due to this. She has experienced some hair thinning with the methotrexate treatment but no other major side effect problems since increasing to 2 mg of folic acid daily.  She also describes a problem of brain fog with concentration difficulties she feels like she has attention deficit problems that this is worsened during the past 2 years.  This did not correlate in particular with a change to third shift work, although her sleep has been more disrupted with this schedule change. Previous hand x-rays did not demonstrate any erosive disease changes.  She does not recall having any prior chest x-ray.   Labs  09/2020 RF+ CCP+ ANA 1:320   08/2019 HBV neg HCV neg HIV neg   12/2020 CBC wnl CMP wnl    Review of Systems  Constitutional:  Positive for fatigue.  HENT:  Positive for mouth sores and mouth dryness.   Eyes:  Positive for dryness.  Respiratory:  Negative for shortness of breath.   Cardiovascular:  Negative for chest pain and palpitations.  Gastrointestinal:  Negative for blood in stool, constipation and diarrhea.  Endocrine: Negative for increased urination.  Genitourinary:  Negative for involuntary urination.  Musculoskeletal:  Positive for myalgias, muscle tenderness and myalgias. Negative for joint pain, gait problem, joint pain, joint swelling, muscle weakness and morning stiffness.  Skin:  Negative for color change, rash, hair loss and sensitivity to sunlight.  Allergic/Immunologic: Negative for susceptible to infections.  Neurological:  Negative for dizziness and headaches.  Hematological:  Negative for swollen glands.  Psychiatric/Behavioral:  Negative for depressed mood and sleep disturbance. The patient is not nervous/anxious.     PMFS History:  Patient Active Problem List   Diagnosis Date Noted    Sleep disorder 02/18/2022   Other fatigue 02/18/2022   Anemia 02/18/2022   Encounter for female family planning counseling 02/18/2022   Patellofemoral pain syndrome of both knees 08/16/2021   Vitamin D deficiency 05/17/2021   Obesity (BMI 35.0-39.9 without comorbidity) 09/08/2018   Rheumatoid arthritis involving multiple sites with positive rheumatoid factor (HCC) 08/14/2018   High risk medication use 08/14/2018    Past Medical History:  Diagnosis Date   Rheumatoid arthritis (HCC)    Seasonal allergies     Family History  Problem Relation Age of Onset   Hypertension Mother    Thyroid disease Mother    Anemia Mother    Hypertension Father    Anemia Sister        oldest   Kidney failure Brother    Hypertension Maternal Grandmother    Diabetes Maternal Grandmother    Hypertension Maternal Grandfather    Hypertension Paternal Grandmother    Breast cancer Paternal Grandmother  unknown age of onset   Hypertension Paternal Grandfather    Past Surgical History:  Procedure Laterality Date   HERNIA REPAIR     Social History   Social History Narrative   One 24 year old son    In a two year relationship with her s/o , female    Immunization History  Administered Date(s) Administered   Influenza,inj,Quad PF,6+ Mos 06/21/2014   PFIZER(Purple Top)SARS-COV-2 Vaccination 07/27/2019, 09/06/2019   Pneumococcal Conjugate-13 11/20/2018   Pneumococcal Polysaccharide-23 02/22/2019   Tdap 09/20/2014     Objective: Vital Signs: BP 115/75 (BP Location: Left Arm, Patient Position: Sitting, Cuff Size: Large)   Pulse 60   Resp 15   Ht 5\' 2"  (1.575 m)   Wt 196 lb 9.6 oz (89.2 kg)   BMI 35.96 kg/m    Physical Exam Constitutional:      Appearance: She is obese.  Cardiovascular:     Rate and Rhythm: Normal rate and regular rhythm.  Pulmonary:     Effort: Pulmonary effort is normal.     Breath sounds: Normal breath sounds.  Skin:    General: Skin is warm and dry.      Findings: No rash.  Neurological:     Mental Status: She is alert.  Psychiatric:        Mood and Affect: Mood normal.      Musculoskeletal Exam:  Shoulders full ROM no tenderness or swelling Elbows full ROM no tenderness or swelling Wrists full ROM no tenderness or swelling Fingers full ROM no tenderness or swelling Knees full ROM no tenderness or swelling Ankles full ROM no tenderness or swelling   CDAI Exam: CDAI Score: 3  Patient Global: 20 mm; Provider Global: 10 mm Swollen: 0 ; Tender: 0  Joint Exam 11/13/2022   All documented joints were normal     Investigation: No additional findings.  Imaging: No results found.  Recent Labs: Lab Results  Component Value Date   WBC 3.7 (L) 11/13/2022   HGB 10.7 (L) 11/13/2022   PLT 291 11/13/2022   NA 137 11/13/2022   K 3.9 11/13/2022   CL 103 11/13/2022   CO2 25 11/13/2022   GLUCOSE 79 11/13/2022   BUN 8 11/13/2022   CREATININE 0.73 11/13/2022   BILITOT 0.5 11/13/2022   ALKPHOS 69 11/15/2012   AST 14 11/13/2022   ALT 21 11/13/2022   PROT 6.8 11/13/2022   ALBUMIN 3.8 11/15/2012   CALCIUM 9.1 11/13/2022   GFRAA >60 11/15/2012   QFTBGOLDPLUS NEGATIVE 02/28/2022    Speciality Comments: No specialty comments available.  Procedures:  No procedures performed Allergies: Patient has no known allergies.   Assessment / Plan:     Visit Diagnoses: Rheumatoid arthritis involving multiple sites with positive rheumatoid factor (HCC) - Plan: Sedimentation rate, C-reactive protein, methotrexate (RHEUMATREX) 2.5 MG tablet, Adalimumab (HUMIRA, 2 PEN,) 40 MG/0.4ML PNKT, DISCONTINUED: Adalimumab (HUMIRA, 2 PEN,) 40 MG/0.4ML PNKT  Inflammation appears well-controlled today though she is still reporting episodic worsening of symptoms most often before Humira doses so for couple days each month.  Overall low disease plan is to continue current treatment.  Will also recheck sed rate and CRP for disease activity monitoring.  Continue  Humira 40 mg subcu q. 14 days methotrexate 20 mg p.o. weekly and folic acid 1 mg daily.  High risk medication use - Plan: CBC with Differential/Platelet, COMPLETE METABOLIC PANEL WITH GFR  Checking CBC and CMP for medication monitoring on continued Humira and methotrexate.  No significant interval  infections or medication intolerance.  Orders: Orders Placed This Encounter  Procedures   Sedimentation rate   C-reactive protein   CBC with Differential/Platelet   COMPLETE METABOLIC PANEL WITH GFR   Meds ordered this encounter  Medications   DISCONTD: Adalimumab (HUMIRA, 2 PEN,) 40 MG/0.4ML PNKT    Sig: Inject 40 mg into the skin every 14 (fourteen) days.    Dispense:  2 each    Refill:  5   methotrexate (RHEUMATREX) 2.5 MG tablet    Sig: Take 8 tablets (20 mg total) by mouth once a week. Caution:Chemotherapy. Protect from light.    Dispense:  96 tablet    Refill:  0   Adalimumab (HUMIRA, 2 PEN,) 40 MG/0.4ML PNKT    Sig: Inject 40 mg into the skin every 14 (fourteen) days.    Dispense:  2 each    Refill:  5     Follow-Up Instructions: Return in about 3 months (around 02/12/2023) for RA on ADA/MTX f/u 3mos.   Fuller Plan, MD  Note - This record has been created using [employer].  Chart creation errors have been sought, but may not always  have been located. Such creation errors do not reflect on  the standard of medical care.

## 2022-11-14 LAB — COMPLETE METABOLIC PANEL WITH GFR
AG Ratio: 1.5 (calc) (ref 1.0–2.5)
ALT: 21 U/L (ref 6–29)
AST: 14 U/L (ref 10–30)
Albumin: 4.1 g/dL (ref 3.6–5.1)
Alkaline phosphatase (APISO): 59 U/L (ref 31–125)
BUN: 8 mg/dL (ref 7–25)
CO2: 25 mmol/L (ref 20–32)
Calcium: 9.1 mg/dL (ref 8.6–10.2)
Chloride: 103 mmol/L (ref 98–110)
Creat: 0.73 mg/dL (ref 0.50–0.97)
Globulin: 2.7 g/dL (calc) (ref 1.9–3.7)
Glucose, Bld: 79 mg/dL (ref 65–99)
Potassium: 3.9 mmol/L (ref 3.5–5.3)
Sodium: 137 mmol/L (ref 135–146)
Total Bilirubin: 0.5 mg/dL (ref 0.2–1.2)
Total Protein: 6.8 g/dL (ref 6.1–8.1)
eGFR: 111 mL/min/{1.73_m2} (ref >=60)

## 2022-11-14 LAB — C-REACTIVE PROTEIN: CRP: <3 mg/L (ref <8.0)

## 2022-11-14 LAB — CBC WITH DIFFERENTIAL/PLATELET
Absolute Monocytes: 318 cells/uL (ref 200–950)
Basophils Absolute: 19 cells/uL (ref 0–200)
Basophils Relative: 0.5 %
Eosinophils Absolute: 70 cells/uL (ref 15–500)
Eosinophils Relative: 1.9 %
HCT: 32.3 % — ABNORMAL LOW (ref 35.0–45.0)
Hemoglobin: 10.7 g/dL — ABNORMAL LOW (ref 11.7–15.5)
Lymphs Abs: 1428 cells/uL (ref 850–3900)
MCH: 29.6 pg (ref 27.0–33.0)
MCHC: 33.1 g/dL (ref 32.0–36.0)
MCV: 89.2 fL (ref 80.0–100.0)
MPV: 10.5 fL (ref 7.5–12.5)
Monocytes Relative: 8.6 %
Neutro Abs: 1865 cells/uL (ref 1500–7800)
Neutrophils Relative %: 50.4 %
Platelets: 291 10*3/uL (ref 140–400)
RBC: 3.62 10*6/uL — ABNORMAL LOW (ref 3.80–5.10)
RDW: 14.9 % (ref 11.0–15.0)
Total Lymphocyte: 38.6 %
WBC: 3.7 10*3/uL — ABNORMAL LOW (ref 3.8–10.8)

## 2022-11-14 LAB — SEDIMENTATION RATE: Sed Rate: 11 mm/h (ref 0–20)

## 2022-11-14 NOTE — Progress Notes (Signed)
Blood count Hgb 10.7 and WBC 3.7 slightly low. Other labs normal. Okay to continue current medication.

## 2022-11-20 DIAGNOSIS — Z419 Encounter for procedure for purposes other than remedying health state, unspecified: Secondary | ICD-10-CM | POA: Diagnosis not present

## 2022-11-25 ENCOUNTER — Telehealth: Payer: Self-pay | Admitting: Internal Medicine

## 2022-11-25 NOTE — Telephone Encounter (Signed)
Discussed she needs intermittently if FMLA as she is missing some amount of work due to symptom flareups preventing her from completing her work and increased brain fog during these episodes as well.  Recommended her to drop off the paperwork for this to the office she plans to bring it later this week and has a deadline for the 17th.

## 2022-11-25 NOTE — Telephone Encounter (Signed)
Patient called stating she is interested in applying for FMLA and wanted to discuss this with Dr. Dimple Casey.  Patient requested a return call.

## 2022-12-21 DIAGNOSIS — Z419 Encounter for procedure for purposes other than remedying health state, unspecified: Secondary | ICD-10-CM | POA: Diagnosis not present

## 2023-01-01 ENCOUNTER — Telehealth: Payer: Self-pay | Admitting: *Deleted

## 2023-01-01 NOTE — Telephone Encounter (Signed)
Received a call from [facility] stating the insurance plan ins requesting an acceptance to coverage form to be submitted for Humira. Call back number for insurance plan is [phone].

## 2023-01-02 ENCOUNTER — Telehealth: Payer: Self-pay

## 2023-01-02 NOTE — Telephone Encounter (Signed)
Submitted a Prior Authorization renewal request to  [employer]  for [facility] via fax. Will update once we receive a response.  Phone: [phone] Fax: [phone]  Episode ID #: *[id number]*

## 2023-01-06 ENCOUNTER — Telehealth: Payer: Self-pay | Admitting: Internal Medicine

## 2023-01-06 ENCOUNTER — Encounter: Payer: Self-pay | Admitting: Internal Medicine

## 2023-01-06 NOTE — Telephone Encounter (Unsigned)
Patient called stating she sent a mychart message to let Dr. Dimple Casey know that she was given a code for the pharmacist.  Patient states she is due to take her injection on Wednesday, 01/08/23 and requested a return call to let her know if it will be processed in time.

## 2023-01-07 ENCOUNTER — Telehealth: Payer: Self-pay | Admitting: *Deleted

## 2023-01-07 ENCOUNTER — Telehealth: Payer: Self-pay | Admitting: Pharmacist

## 2023-01-07 ENCOUNTER — Other Ambulatory Visit (HOSPITAL_COMMUNITY): Payer: Self-pay

## 2023-01-07 DIAGNOSIS — M0579 Rheumatoid arthritis with rheumatoid factor of multiple sites without organ or systems involvement: Secondary | ICD-10-CM

## 2023-01-07 DIAGNOSIS — Z79899 Other long term (current) drug therapy: Secondary | ICD-10-CM

## 2023-01-07 MED ORDER — ADALIMUMAB-ADAZ 40 MG/0.4ML ~~LOC~~ SOAJ
40.0000 mg | SUBCUTANEOUS | 1 refills | Status: DC
Start: 2023-01-07 — End: 2023-02-20

## 2023-01-07 NOTE — Telephone Encounter (Signed)
Called [facility] to determine what is needed to process adalimumab-adaz rx. Per rep, they are still processing biosim rx and will reach out to patient within 24 hours to schedle if ready. I spoke with patient and advised her to provide savings card information to help reduce copay. She will send message back to [place] via mychart or call if another override needs to be placed  Chesley Mires, PharmD, MPH, BCPS, CPP Clinical Pharmacist (Rheumatology and Pulmonology)

## 2023-01-07 NOTE — Telephone Encounter (Signed)
Patient's insurance denied Humira and now Katie Sanders is one of the preferred options. Submitted a Prior Authorization request to  [employer]  for  adalimumab-adaz  via CoverMyMeds. Will update once we receive a response.  [name]  Per automated response: An active authorization already exists for this patient. Proceed with prescribing the Medication or call Pharmacy Customer Care at [phone]. PA # [id number] Effective: 08/22/2022 through 07/22/2023  Enrolled patient into copay card:  MyChart message sent to patient.  Chesley Mires, PharmD, MPH, BCPS, CPP Clinical Pharmacist (Rheumatology and Pulmonology)

## 2023-01-07 NOTE — Telephone Encounter (Signed)
Patient contacted the office stating that what was submitted on 01/02/2023 was denied. Patient states it is because she has to try the alternative medication. Patient states her prescription will need to go to the [employer] [facility]. Patient was advised that a prescription has been sent this morning. Patient states she contacted the pharmacy who told her to contact the insurance company and the insurance company advised her to call our office.  Can you please help the patient? Thanks!

## 2023-01-07 NOTE — Telephone Encounter (Signed)
Patient has been denied Humira because she must try and fail formulary alternatives: adalimumab-adaz (Hyrimoz equivalent), adalimumab-fkjp (Hulio equivalent), adalimumab-aaty (Yuflyma equivalent), Enbrel, Orencia, [place], [place], Actemra, Rinvoq, [place], Cimzia.  Chesley Mires, PharmD, MPH, BCPS, CPP Clinical Pharmacist (Rheumatology and Pulmonology)

## 2023-01-07 NOTE — Telephone Encounter (Signed)
Spoke to patient about denial of Humira by insurance and told her that they now prefer a biosimilar. Patient confirmed that she would be okay switching to a biosimilar. She will be coming by today to pick up a sample of Humira since her dose is due tomorrow.   Katie Sanders, PharmD Student [employer]

## 2023-01-07 NOTE — Telephone Encounter (Signed)
Patient has been denied Humira because she must try and fail formulary alternatives: adalimumab-adaz (Hyrimoz equivalent), adalimumab-fkjp (Hulio equivalent), adalimumab-aaty (Yuflyma equivalent), Enbrel, Orencia, Kevzara, Olumiant, Actemra, Rinvoq, Xeljanz, Cimzia.  Brantley Wiley, PharmD, MPH, BCPS, CPP Clinical Pharmacist (Rheumatology and Pulmonology) 

## 2023-01-07 NOTE — Telephone Encounter (Signed)
Patient has been denied Humira because she must try and fail formulary alternatives: adalimumab-adaz (Hyrimoz equivalent), adalimumab-fkjp (Hulio equivalent), adalimumab-aaty (Yuflyma equivalent), Enbrel, Orencia, Kevzara, Olumiant, Actemra, Rinvoq, Xeljanz, Cimzia.  Huckleberry Martinson, PharmD, MPH, BCPS, CPP Clinical Pharmacist (Rheumatology and Pulmonology) 

## 2023-01-14 NOTE — Telephone Encounter (Signed)
Spoke with [facility] [employer] [employer]. Per rep, patient received adalimumab-adaz shipment at home on 01/08/23 with no copay. Copay card was successfully utilized. Nothing further should be needed  Chesley Mires, PharmD, MPH, BCPS, CPP Clinical Pharmacist (Rheumatology and Pulmonology)

## 2023-01-20 DIAGNOSIS — Z419 Encounter for procedure for purposes other than remedying health state, unspecified: Secondary | ICD-10-CM | POA: Diagnosis not present

## 2023-02-02 ENCOUNTER — Other Ambulatory Visit: Payer: Self-pay | Admitting: Internal Medicine

## 2023-02-02 DIAGNOSIS — M0579 Rheumatoid arthritis with rheumatoid factor of multiple sites without organ or systems involvement: Secondary | ICD-10-CM

## 2023-02-03 NOTE — Telephone Encounter (Signed)
Last Fill: 11/13/2022  Labs: 11/13/2022 Blood count Hgb 10.7 and WBC 3.7 slightly low. Other labs normal. Okay to continue current medication.   Next Visit: 02/12/2023  Last Visit: 11/13/2022  DX: Rheumatoid arthritis involving multiple sites with positive rheumatoid factor   Current Dose per office note 11/13/2022: methotrexate 20 mg p.o. weekly   Okay to refill Methotrexate?

## 2023-02-06 NOTE — Progress Notes (Signed)
Office Visit Note  Patient: Katie Sanders             Date of Birth: [birth year]           MRN: [id number]             PCP: Mort Sawyers, FNP Referring: Mort Sawyers, FNP Visit Date: 02/12/2023   Subjective:  Follow-up   History of Present Illness: Katie Sanders is a 32 y.o. 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 14 days, methotrexate 20 mg p.o. weekly, and folic acid 1 mg daily. She switched to Hyrimoz due to insurance preference so far 1 injection and did not notice any issue with this. Joint swelling is doing okay has morning stiffness about 30 minutes daily. Knee pain is worse with walking long distance or climbing stairs and requests handicap placard renewal. Shoulder pain is very acute provoked with certain movements she is not sure if coming from her RA or not.  Previous HPI 11/13/2022 Katie Sanders is a 32 y.o. 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 14 days methotrexate 20 mg p.o. weekly folic acid 1 mg daily.  Overall symptoms are doing well still has a little bit of increased pain stiffness and movement trouble for up to a couple days before each Humira dose.  She has added additional walking exercise aiming for 10,000 steps or greater daily during days with low symptom severity.  Does occasionally miss work on the few days where symptoms flareup more partially from pain and swelling but also from increased fatigue.   Previous HPI 08/12/22 Katie Sanders is a 32 y.o. female here for follow up for seropositive RA on Humira and methotrexate 20 mg PO weekly and folic acid 1 mg daily. Symptoms are doing well overall but notices increased pain and stiffness for about 3 or 4 days before each Humira dose is due. Problems mostly in MCP joints of her hands and in the left knee. Knee swelling is usually on the lateral side of the joint. On the rest of days rates this just 2-3/10 in severity.      Previous  HPI 02/28/22 Katie Sanders is a 32 y.o. female here for follow up for seropositive RA on Humira 40 mg [place] q14days and MTX 20 mg PO weekly. Since our last visit she is feeling well without any major flare ups. Mild swelling in finger joints, most often 2nd and 3rd fingers of both hands but none today. The chest pain she was experiencing resolved and no new episodes after April. She has no new infections or trouble taking medication.   Previous HPI 11/14/2021 Katie Sanders is a 32 y.o. female here for follow up for seropositive RA on Humira 40 mg [place] q14days and MTX 20 mg PO weekly. Overall doing okay but has some episodes of left sided upper chest pain. Pain does not radiate, not associated with shortness of breath or sweating or palpitations. This is not provoked with positional change or any specific physical activity. This came and went repeatedly but is not acting up today. Joint pain and swelling is coming and going worst in hands, feet, and knees also some elbow pains. This is not too limiting and she notices some symptom variation between Humira doses. Her fatigue is the worst symptom.   Previous HPI 08/16/21 Katie Sanders is a 32 y.o. female here for follow up for seropositive RA on Humira 40 mg  q14days  and MTX 20 mg PO weekly and folic acid 2 mg daily. Labs at last visit showing mild leukopenia at 3.2 and vitamin D insufficiency at 21 recommending starting 1000 units daily supplementation. She has noticed some increased pain in bilateral knees most often worse with climbing stairs or getting up from the toilet or very low seats. She also has some right elbow pain but does not recall any injury or particular motion affecting this. She is also more tired overall with low energy despite no change in sleep.   Previous HPI 05/17/21 Katie Sanders is a 32 y.o. 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  She is transferring care from [facility] rheumatology to here due to relocation.  Symptoms started with joint pain particularly involving her bilateral hands and knees since 5 or 6 years ago but she mostly ignored these until severity increased evaluation for this started treatment with rheumatology in about 2020 initially hydroxychloroquine was not effective or not well-tolerated he subsequently started methotrexate and then after incomplete response also started Humira.  Disease has been well controlled on this regimen although she did experience missing her Humira treatment for an extra approximately 2 weeks due to waiting on prior authorization for prescription refill causing some symptom exacerbation and she took a short steroid taper about a month ago due to this. She has experienced some hair thinning with the methotrexate treatment but no other major side effect problems since increasing to 2 mg of folic acid daily.  She also describes a problem of brain fog with concentration difficulties she feels like she has attention deficit problems that this is worsened during the past 2 years.  This did not correlate in particular with a change to third shift work, although her sleep has been more disrupted with this schedule change. Previous hand x-rays did not demonstrate any erosive disease changes.  She does not recall having any prior chest x-ray.   Labs  09/2020 RF+ CCP+ ANA 1:320   08/2019 HBV neg HCV neg HIV neg   12/2020 CBC wnl CMP wnl   Review of Systems  Constitutional:  Positive for fatigue.  HENT:  Positive for mouth sores and mouth dryness.   Eyes:  Negative for dryness.  Respiratory:  Negative for shortness of breath.   Cardiovascular:  Positive for chest pain and palpitations.  Gastrointestinal:  Positive for constipation. Negative for blood in stool and diarrhea.  Endocrine: Positive for increased urination.  Genitourinary:  Negative for involuntary urination.   Musculoskeletal:  Positive for joint pain, joint pain, joint swelling, myalgias, morning stiffness and myalgias. Negative for gait problem, muscle weakness and muscle tenderness.  Skin:  Negative for color change, rash, hair loss and sensitivity to sunlight.  Allergic/Immunologic: Negative for susceptible to infections.  Neurological:  Positive for headaches. Negative for dizziness.  Hematological:  Negative for swollen glands.  Psychiatric/Behavioral:  Negative for depressed mood and sleep disturbance. The patient is not nervous/anxious.     PMFS History:  Patient Active Problem List   Diagnosis Date Noted   Sleep disorder 02/18/2022   Other fatigue 02/18/2022   Anemia 02/18/2022   Encounter for female family planning counseling 02/18/2022   Patellofemoral pain syndrome of both knees 08/16/2021   Vitamin D deficiency 05/17/2021   Obesity (BMI 35.0-39.9 without comorbidity) 09/08/2018   Rheumatoid arthritis involving multiple sites with positive rheumatoid factor (HCC) 08/14/2018   High risk medication  use 08/14/2018    Past Medical History:  Diagnosis Date   Rheumatoid arthritis (HCC)    Seasonal allergies     Family History  Problem Relation Age of Onset   Hypertension Mother    Thyroid disease Mother    Anemia Mother    Hypertension Father    Anemia Sister        oldest   Kidney failure Brother    Hypertension Maternal Grandmother    Diabetes Maternal Grandmother    Hypertension Maternal Grandfather    Hypertension Paternal Grandmother    Breast cancer Paternal Grandmother        unknown age of onset   Hypertension Paternal Grandfather    Past Surgical History:  Procedure Laterality Date   HERNIA REPAIR     Social History   Social History Narrative   One 64 year old son    In a two year relationship with her s/o , female    Immunization History  Administered Date(s) Administered   Influenza,inj,Quad PF,6+ Mos 06/21/2014   PFIZER(Purple Top)SARS-COV-2  Vaccination 07/27/2019, 09/06/2019   Pneumococcal Conjugate-13 11/20/2018   Pneumococcal Polysaccharide-23 02/22/2019   Tdap 09/20/2014     Objective: Vital Signs: BP 124/82 (BP Location: Left Arm, Patient Position: Sitting, Cuff Size: Normal)   Pulse 60   Resp 14   Ht 5\' 2"  (1.575 m)   Wt 195 lb 12.8 oz (88.8 kg)   LMP 02/12/2023   BMI 35.81 kg/m    Physical Exam Constitutional:      Appearance: She is obese.  Eyes:     Conjunctiva/sclera: Conjunctivae normal.  Cardiovascular:     Rate and Rhythm: Normal rate and regular rhythm.  Pulmonary:     Effort: Pulmonary effort is normal.     Breath sounds: Normal breath sounds.  Musculoskeletal:     Right lower leg: No edema.     Left lower leg: No edema.  Lymphadenopathy:     Cervical: No cervical adenopathy.  Skin:    General: Skin is warm and dry.     Findings: No rash.  Neurological:     Mental Status: She is alert.  Psychiatric:        Mood and Affect: Mood normal.      Musculoskeletal Exam:  Right shoulder mild tenderness no palpable swelling, has shoulder pain provoked with full abduction and reaching behind back, around lateral border or beneath acromion Elbows full ROM no tenderness or swelling Wrists full ROM no tenderness or swelling Fingers full ROM no tenderness or swelling Knees full ROM no tenderness or swelling   CDAI Exam: CDAI Score: 4  Patient Global: 20 / 100; Provider Global: 10 / 100 Swollen: 0 ; Tender: 1  Joint Exam 02/12/2023      Right  Left  Glenohumeral   Tender        Investigation: No additional findings.  Imaging: XR Shoulder Left  Result Date: 02/12/2023 X-ray left shoulder 4 views Humerus and normal internal/external rotation position.  Glenohumeral joint space appears normal.  Acromiohumeral distance is normal.  AC joint with is normal.  No erosions or abnormal calcification or visible effusion seen. Impression Normal shoulder x-ray  XR Shoulder Right  Result Date:  02/12/2023 X-ray right shoulder 4 views Humerus and normal internal/external rotation position.  Glenohumeral joint space appears normal.  Acromiohumeral distance is normal.  AC joint with is normal.  No erosions or abnormal calcification or visible effusion seen. Impression Normal shoulder x-ray   Recent Labs:  Lab Results  Component Value Date   WBC 3.7 (L) 11/13/2022   HGB 10.7 (L) 11/13/2022   PLT 291 11/13/2022   NA 137 11/13/2022   K 3.9 11/13/2022   CL 103 11/13/2022   CO2 25 11/13/2022   GLUCOSE 79 11/13/2022   BUN 8 11/13/2022   CREATININE 0.73 11/13/2022   BILITOT 0.5 11/13/2022   ALKPHOS 69 11/15/2012   AST 14 11/13/2022   ALT 21 11/13/2022   PROT 6.8 11/13/2022   ALBUMIN 3.8 11/15/2012   CALCIUM 9.1 11/13/2022   GFRAA >60 11/15/2012   QFTBGOLDPLUS NEGATIVE 02/28/2022    Speciality Comments: No specialty comments available.  Procedures:  No procedures performed Allergies: Patient has no known allergies.   Assessment / Plan:     Visit Diagnoses: Rheumatoid arthritis involving multiple sites with positive rheumatoid factor (HCC) - Plan: Sedimentation rate  Some ongoing joint pain morning stiffness but no peripheral synovitis appreciable on exam today for severe flareups.  Checking sed rate for disease activity monitoring.  Plan is to continue on Humira 40 mg subcu q. 14 days methotrexate 20 mg p.o. weekly and folic acid 1 mg daily.  High risk medication use - Humira 40 mg subcu q. 14 days, methotrexate 20 mg p.o. weekly, and folic acid 1 mg daily - Plan: COMPLETE METABOLIC PANEL WITH GFR, CBC with Differential/Platelet  Checking CBC and CMP for medication monitoring on long-term continued use of Humira and methotrexate.  Had very mildly low white blood cell count on last visit.  No serious interval infections.  Most recent QuantiFERON test within last year negative.  Patellofemoral pain syndrome of both knees  Ongoing knee pain particularly worse with long distance  walking or ascending and descending stairs.  Provided new handicap placard form.  Chronic pain of both shoulders - Plan: XR Shoulder Right, XR Shoulder Left  Chronic pain in both shoulders provoked more with Pacific range of motion no appreciable swelling on exam.  X-ray checked in clinic today negative for any particular degenerative or erosive changes.  Suspect there is some component of bursitis or impingement going on since RA looks otherwise well-controlled provided printed at home rehab and range of motion exercises for this.  Orders: Orders Placed This Encounter  Procedures   XR Shoulder Right   XR Shoulder Left   Sedimentation rate   COMPLETE METABOLIC PANEL WITH GFR   CBC with Differential/Platelet   No orders of the defined types were placed in this encounter.    Follow-Up Instructions: Return in about 3 months (around 05/15/2023).   Fuller Plan, MD  Note - This record has been created using [employer].  Chart creation errors have been sought, but may not always  have been located. Such creation errors do not reflect on  the standard of medical care.

## 2023-02-12 ENCOUNTER — Ambulatory Visit: Payer: Managed Care, Other (non HMO) | Attending: Internal Medicine | Admitting: Internal Medicine

## 2023-02-12 ENCOUNTER — Ambulatory Visit (INDEPENDENT_AMBULATORY_CARE_PROVIDER_SITE_OTHER): Payer: Managed Care, Other (non HMO)

## 2023-02-12 ENCOUNTER — Ambulatory Visit: Payer: Managed Care, Other (non HMO)

## 2023-02-12 ENCOUNTER — Encounter: Payer: Self-pay | Admitting: Internal Medicine

## 2023-02-12 VITALS — BP 124/82 | HR 60 | Resp 14 | Ht 62.0 in | Wt 195.8 lb

## 2023-02-12 DIAGNOSIS — M25512 Pain in left shoulder: Secondary | ICD-10-CM | POA: Diagnosis not present

## 2023-02-12 DIAGNOSIS — G8929 Other chronic pain: Secondary | ICD-10-CM | POA: Diagnosis not present

## 2023-02-12 DIAGNOSIS — Z79899 Other long term (current) drug therapy: Secondary | ICD-10-CM

## 2023-02-12 DIAGNOSIS — M25511 Pain in right shoulder: Secondary | ICD-10-CM

## 2023-02-12 DIAGNOSIS — M0579 Rheumatoid arthritis with rheumatoid factor of multiple sites without organ or systems involvement: Secondary | ICD-10-CM | POA: Diagnosis not present

## 2023-02-12 DIAGNOSIS — M222X2 Patellofemoral disorders, left knee: Secondary | ICD-10-CM

## 2023-02-12 DIAGNOSIS — M222X1 Patellofemoral disorders, right knee: Secondary | ICD-10-CM

## 2023-02-12 LAB — CBC WITH DIFFERENTIAL/PLATELET
Absolute Monocytes: 231 cells/uL (ref 200–950)
Basophils Absolute: 21 cells/uL (ref 0–200)
Eosinophils Absolute: 172 cells/uL (ref 15–500)
Eosinophils Relative: 4.9 %
Hemoglobin: 12.6 g/dL (ref 11.7–15.5)
Lymphs Abs: 1551 cells/uL (ref 850–3900)
MCHC: 32.6 g/dL (ref 32.0–36.0)
MPV: 10.3 fL (ref 7.5–12.5)
Neutrophils Relative %: 43.6 %
Platelets: 265 10*3/uL (ref 140–400)
RBC: 3.98 10*6/uL (ref 3.80–5.10)
RDW: 14.5 % (ref 11.0–15.0)
Total Lymphocyte: 44.3 %
WBC: 3.5 10*3/uL — ABNORMAL LOW (ref 3.8–10.8)

## 2023-02-12 LAB — SEDIMENTATION RATE: Sed Rate: 14 mm/h (ref 0–20)

## 2023-02-13 LAB — COMPLETE METABOLIC PANEL WITH GFR
AG Ratio: 1.4 (calc) (ref 1.0–2.5)
AST: 15 U/L (ref 10–30)
Albumin: 4.4 g/dL (ref 3.6–5.1)
Alkaline phosphatase (APISO): 65 U/L (ref 31–125)
BUN: 11 mg/dL (ref 7–25)
CO2: 27 mmol/L (ref 20–32)
Calcium: 9.3 mg/dL (ref 8.6–10.2)
Chloride: 103 mmol/L (ref 98–110)
Creat: 0.75 mg/dL (ref 0.50–0.97)
Globulin: 3.2 g/dL (calc) (ref 1.9–3.7)
Potassium: 4.5 mmol/L (ref 3.5–5.3)
Sodium: 137 mmol/L (ref 135–146)
Total Protein: 7.6 g/dL (ref 6.1–8.1)
eGFR: 108 mL/min/{1.73_m2} (ref >=60)

## 2023-02-13 LAB — CBC WITH DIFFERENTIAL/PLATELET
Basophils Relative: 0.6 %
HCT: 38.6 % (ref 35.0–45.0)
MCH: 31.7 pg (ref 27.0–33.0)
MCV: 97 fL (ref 80.0–100.0)
Monocytes Relative: 6.6 %
Neutro Abs: 1526 cells/uL (ref 1500–7800)

## 2023-02-19 ENCOUNTER — Telehealth: Payer: Self-pay | Admitting: Internal Medicine

## 2023-02-19 ENCOUNTER — Other Ambulatory Visit: Payer: Self-pay | Admitting: *Deleted

## 2023-02-19 DIAGNOSIS — M0579 Rheumatoid arthritis with rheumatoid factor of multiple sites without organ or systems involvement: Secondary | ICD-10-CM

## 2023-02-19 DIAGNOSIS — Z79899 Other long term (current) drug therapy: Secondary | ICD-10-CM

## 2023-02-19 NOTE — Telephone Encounter (Signed)
Pt is requesting a referral be sent to [facility] in [place]. Fax number is [phone]

## 2023-02-20 ENCOUNTER — Other Ambulatory Visit: Payer: Self-pay

## 2023-02-20 DIAGNOSIS — M0579 Rheumatoid arthritis with rheumatoid factor of multiple sites without organ or systems involvement: Secondary | ICD-10-CM

## 2023-02-20 DIAGNOSIS — Z419 Encounter for procedure for purposes other than remedying health state, unspecified: Secondary | ICD-10-CM | POA: Diagnosis not present

## 2023-02-20 DIAGNOSIS — Z79899 Other long term (current) drug therapy: Secondary | ICD-10-CM

## 2023-02-20 MED ORDER — ADALIMUMAB-ADAZ 40 MG/0.4ML ~~LOC~~ SOAJ
40.0000 mg | SUBCUTANEOUS | 2 refills | Status: AC
Start: 2023-02-20 — End: ?

## 2023-02-20 NOTE — Telephone Encounter (Signed)
Refill request received via fax from [facility] for Hyrimoz  Last Fill: 01/07/2023  Labs: 02/12/2023  WBC 3.5  TB Gold: 02/28/2022 Negative   Next Visit: Due 05/15/2023. Message sent to the front to schedule.   Last Visit: 02/12/2023  [id number] arthritis involving multiple sites with positive rheumatoid factor   Current Dose per office note 02/12/2023: Humira 40 mg subcu q. 14 days   Okay to refill Hyrimoz?

## 2023-02-20 NOTE — Progress Notes (Signed)
Lab results look okay her sedimentation rate and metabolic panel are normal. The white blood cell count is slightly low at 3.5 but no problem for continuing adalimumab. I am sending a 3 month prescription for her medication. Okay with referral to [facility] to establish care [place].

## 2023-02-20 NOTE — Telephone Encounter (Signed)
Pt is moving to [place] and will no longer be able to schedule anymore follow ups. Pt is requesting a referral be sent to [facility]

## 2023-02-20 NOTE — Telephone Encounter (Signed)
Please schedule patient a follow up visit. Patient due 04/2023. Thanks!

## 2023-02-20 NOTE — Telephone Encounter (Signed)
Please advise 

## 2023-02-21 ENCOUNTER — Telehealth: Payer: Self-pay

## 2023-02-21 NOTE — Telephone Encounter (Signed)
Please send a referral to [facility] for her.

## 2023-02-21 NOTE — Telephone Encounter (Signed)
Contacted the patient to advise Dr. Dimple Casey has sent in a 90 prescription of the Adalimumab because she is leaving the practice and moving to [facility]. Patient states she would like a referral to [facility] in [place]. The fax number for [facility] is [phone] and the phone number is [phone]. Please advise

## 2023-02-21 NOTE — Telephone Encounter (Signed)
Patient states her new address will be [address] [place], [place], [address].

## 2023-02-25 ENCOUNTER — Telehealth: Payer: Self-pay | Admitting: Internal Medicine

## 2023-02-25 NOTE — Telephone Encounter (Signed)
Pt called requesting her referral to [facility] be faxed over again due to them never receiving it.

## 2023-02-25 NOTE — Telephone Encounter (Signed)
Referral re faxed.

## 2023-03-05 ENCOUNTER — Other Ambulatory Visit: Payer: Self-pay | Admitting: Internal Medicine

## 2023-03-05 DIAGNOSIS — M0579 Rheumatoid arthritis with rheumatoid factor of multiple sites without organ or systems involvement: Secondary | ICD-10-CM

## 2023-03-06 ENCOUNTER — Telehealth: Payer: Self-pay | Admitting: Internal Medicine

## 2023-03-06 MED ORDER — METHOTREXATE SODIUM 2.5 MG PO TABS
20.0000 mg | ORAL_TABLET | ORAL | 2 refills | Status: AC
Start: 2023-03-06 — End: ?

## 2023-03-06 NOTE — Telephone Encounter (Signed)
Last Fill: 02/03/2023 (30 day supply)  Labs: 02/12/2023 Lab results look okay her sedimentation rate and metabolic panel are normal. The white blood cell count is slightly low at 3.5 but no problem for continuing adalimumab.   Next Visit: Patient transferring care  Last Visit: 02/12/2023  DX: Rheumatoid arthritis involving multiple sites with positive rheumatoid factor   Current Dose per office note 02/12/2023: methotrexate 20 mg p.o. weekly   Okay to refill Methotrexate?

## 2023-03-06 NOTE — Telephone Encounter (Signed)
Pt called needing a refill for methotrexate at the [facility] [address] [place], [place] [facility] [address] Pt also states the referral for [facility] has not been received yet. Pt requested to speak with someone about this.

## 2023-03-06 NOTE — Telephone Encounter (Signed)
I called patient, patient has appt 03/27/2023

## 2023-03-23 DIAGNOSIS — Z419 Encounter for procedure for purposes other than remedying health state, unspecified: Secondary | ICD-10-CM | POA: Diagnosis not present

## 2023-03-26 ENCOUNTER — Ambulatory Visit
Admission: RE | Admit: 2023-03-26 | Discharge: 2023-03-26 | Disposition: A | Discharge status: Home / Self Care | Payer: Managed Care, Other (non HMO) | Source: Ambulatory Visit | Attending: Physician Assistant | Admitting: Physician Assistant

## 2023-03-26 ENCOUNTER — Other Ambulatory Visit: Payer: Self-pay | Admitting: Physician Assistant

## 2023-03-26 DIAGNOSIS — M059 Rheumatoid arthritis with rheumatoid factor, unspecified: Secondary | ICD-10-CM

## 2023-04-22 DIAGNOSIS — Z419 Encounter for procedure for purposes other than remedying health state, unspecified: Secondary | ICD-10-CM | POA: Diagnosis not present

## 2023-05-07 ENCOUNTER — Other Ambulatory Visit: Payer: Self-pay

## 2023-05-07 DIAGNOSIS — Z79899 Other long term (current) drug therapy: Secondary | ICD-10-CM

## 2023-05-07 DIAGNOSIS — M0579 Rheumatoid arthritis with rheumatoid factor of multiple sites without organ or systems involvement: Secondary | ICD-10-CM

## 2023-05-07 NOTE — Telephone Encounter (Signed)
Refill request received via fax from [facility]  for Adalimumab injection.   After reviewing the chart, per refill note on 02/20/2023, patient has moved to [facility] and will not longer be coming to this office. A 3 month supply was sent of the patient's medication on 02/20/2023. Please advise if prescription needs to be sent or not.

## 2023-05-07 NOTE — Telephone Encounter (Signed)
Patient will need to find a new rheumatologist to continue this prescription.

## 2023-05-12 NOTE — Telephone Encounter (Signed)
Contacted the patient and patient states she is already established with a new rheumatologist and does not need refills from our office any longer.

## 2023-05-23 DIAGNOSIS — Z419 Encounter for procedure for purposes other than remedying health state, unspecified: Secondary | ICD-10-CM | POA: Diagnosis not present

## 2023-06-22 DIAGNOSIS — Z419 Encounter for procedure for purposes other than remedying health state, unspecified: Secondary | ICD-10-CM | POA: Diagnosis not present

## 2023-07-23 DIAGNOSIS — Z419 Encounter for procedure for purposes other than remedying health state, unspecified: Secondary | ICD-10-CM | POA: Diagnosis not present

## 2023-08-23 DIAGNOSIS — Z419 Encounter for procedure for purposes other than remedying health state, unspecified: Secondary | ICD-10-CM | POA: Diagnosis not present

## 2023-09-20 DIAGNOSIS — Z419 Encounter for procedure for purposes other than remedying health state, unspecified: Secondary | ICD-10-CM | POA: Diagnosis not present

## 2023-11-01 DIAGNOSIS — Z419 Encounter for procedure for purposes other than remedying health state, unspecified: Secondary | ICD-10-CM | POA: Diagnosis not present
